# Patient Record
Sex: Female | Born: 1980 | Race: White | Hispanic: No | Marital: Single | Smoking: Never smoker
Health system: Southern US, Community
[De-identification: ages and names within clinical notes are randomized; demographics above are authoritative.]

## PROBLEM LIST (undated history)

## (undated) DIAGNOSIS — F429 Obsessive-compulsive disorder, unspecified: Secondary | ICD-10-CM

## (undated) DIAGNOSIS — J45909 Unspecified asthma, uncomplicated: Secondary | ICD-10-CM

## (undated) DIAGNOSIS — T7840XA Allergy, unspecified, initial encounter: Secondary | ICD-10-CM

## (undated) DIAGNOSIS — F32A Depression, unspecified: Secondary | ICD-10-CM

## (undated) DIAGNOSIS — F329 Major depressive disorder, single episode, unspecified: Secondary | ICD-10-CM

## (undated) DIAGNOSIS — F419 Anxiety disorder, unspecified: Secondary | ICD-10-CM

## (undated) DIAGNOSIS — E785 Hyperlipidemia, unspecified: Secondary | ICD-10-CM

## (undated) DIAGNOSIS — G43909 Migraine, unspecified, not intractable, without status migrainosus: Secondary | ICD-10-CM

## (undated) DIAGNOSIS — K589 Irritable bowel syndrome without diarrhea: Secondary | ICD-10-CM

## (undated) DIAGNOSIS — M26629 Arthralgia of temporomandibular joint, unspecified side: Secondary | ICD-10-CM

## (undated) HISTORY — DX: Migraine, unspecified, not intractable, without status migrainosus: G43.909

## (undated) HISTORY — PX: TONSILLECTOMY AND ADENOIDECTOMY: SHX28

## (undated) HISTORY — DX: Allergy, unspecified, initial encounter: T78.40XA

## (undated) HISTORY — DX: Hyperlipidemia, unspecified: E78.5

## (undated) HISTORY — PX: TONSILLECTOMY: SHX5217

## (undated) HISTORY — DX: Anxiety disorder, unspecified: F41.9

## (undated) HISTORY — DX: Major depressive disorder, single episode, unspecified: F32.9

## (undated) HISTORY — DX: Obsessive-compulsive disorder, unspecified: F42.9

## (undated) HISTORY — PX: BIOPSY BREAST: PRO8

## (undated) HISTORY — DX: Irritable bowel syndrome, unspecified: K58.9

## (undated) HISTORY — DX: Depression, unspecified: F32.A

## (undated) HISTORY — DX: Unspecified asthma, uncomplicated: J45.909

## (undated) HISTORY — DX: Arthralgia of temporomandibular joint, unspecified side: M26.629

---

## 1999-03-20 ENCOUNTER — Emergency Department (HOSPITAL_COMMUNITY): Admission: EM | Admit: 1999-03-20 | Discharge: 1999-03-20 | Payer: Self-pay | Admitting: Emergency Medicine

## 1999-09-23 ENCOUNTER — Ambulatory Visit (HOSPITAL_COMMUNITY): Admission: RE | Admit: 1999-09-23 | Discharge: 1999-09-23 | Payer: Self-pay | Admitting: *Deleted

## 2000-01-01 ENCOUNTER — Ambulatory Visit (HOSPITAL_BASED_OUTPATIENT_CLINIC_OR_DEPARTMENT_OTHER): Admission: RE | Admit: 2000-01-01 | Discharge: 2000-01-01 | Payer: Self-pay | Admitting: Surgery

## 2000-08-31 ENCOUNTER — Ambulatory Visit (HOSPITAL_COMMUNITY): Admission: RE | Admit: 2000-08-31 | Discharge: 2000-08-31 | Payer: Self-pay | Admitting: *Deleted

## 2000-10-28 ENCOUNTER — Encounter: Admission: RE | Admit: 2000-10-28 | Discharge: 2000-10-28 | Payer: Self-pay | Admitting: Internal Medicine

## 2000-10-28 ENCOUNTER — Encounter: Payer: Self-pay | Admitting: Internal Medicine

## 2001-07-03 ENCOUNTER — Encounter: Admission: RE | Admit: 2001-07-03 | Discharge: 2001-07-03 | Payer: Self-pay | Admitting: Family Medicine

## 2001-07-03 ENCOUNTER — Encounter: Payer: Self-pay | Admitting: Surgery

## 2001-08-04 ENCOUNTER — Ambulatory Visit (HOSPITAL_BASED_OUTPATIENT_CLINIC_OR_DEPARTMENT_OTHER): Admission: RE | Admit: 2001-08-04 | Discharge: 2001-08-04 | Payer: Self-pay | Admitting: Surgery

## 2001-08-04 ENCOUNTER — Encounter (INDEPENDENT_AMBULATORY_CARE_PROVIDER_SITE_OTHER): Payer: Self-pay | Admitting: Specialist

## 2002-03-28 ENCOUNTER — Encounter: Payer: Self-pay | Admitting: Internal Medicine

## 2002-03-28 ENCOUNTER — Ambulatory Visit (HOSPITAL_COMMUNITY): Admission: RE | Admit: 2002-03-28 | Discharge: 2002-03-28 | Payer: Self-pay | Admitting: Internal Medicine

## 2002-04-02 ENCOUNTER — Encounter: Payer: Self-pay | Admitting: Surgery

## 2002-04-02 ENCOUNTER — Ambulatory Visit (HOSPITAL_COMMUNITY): Admission: RE | Admit: 2002-04-02 | Discharge: 2002-04-02 | Payer: Self-pay | Admitting: Surgery

## 2002-10-07 ENCOUNTER — Emergency Department (HOSPITAL_COMMUNITY): Admission: EM | Admit: 2002-10-07 | Discharge: 2002-10-07 | Payer: Self-pay | Admitting: Emergency Medicine

## 2002-10-18 ENCOUNTER — Encounter: Admission: RE | Admit: 2002-10-18 | Discharge: 2002-10-18 | Payer: Self-pay | Admitting: Internal Medicine

## 2002-10-18 ENCOUNTER — Encounter: Payer: Self-pay | Admitting: Internal Medicine

## 2002-11-20 ENCOUNTER — Ambulatory Visit (HOSPITAL_COMMUNITY): Admission: RE | Admit: 2002-11-20 | Discharge: 2002-11-20 | Payer: Self-pay | Admitting: Internal Medicine

## 2002-11-20 ENCOUNTER — Encounter: Payer: Self-pay | Admitting: Internal Medicine

## 2003-10-19 ENCOUNTER — Emergency Department (HOSPITAL_COMMUNITY): Admission: EM | Admit: 2003-10-19 | Discharge: 2003-10-19 | Payer: Self-pay | Admitting: Emergency Medicine

## 2004-02-18 ENCOUNTER — Encounter (INDEPENDENT_AMBULATORY_CARE_PROVIDER_SITE_OTHER): Payer: Self-pay | Admitting: *Deleted

## 2004-02-18 ENCOUNTER — Ambulatory Visit (HOSPITAL_COMMUNITY): Admission: RE | Admit: 2004-02-18 | Discharge: 2004-02-18 | Payer: Self-pay | Admitting: Surgery

## 2004-02-18 ENCOUNTER — Ambulatory Visit (HOSPITAL_BASED_OUTPATIENT_CLINIC_OR_DEPARTMENT_OTHER): Admission: RE | Admit: 2004-02-18 | Discharge: 2004-02-18 | Payer: Self-pay | Admitting: Surgery

## 2004-03-02 ENCOUNTER — Emergency Department (HOSPITAL_COMMUNITY): Admission: EM | Admit: 2004-03-02 | Discharge: 2004-03-02 | Payer: Self-pay

## 2004-03-09 ENCOUNTER — Ambulatory Visit (HOSPITAL_COMMUNITY): Admission: RE | Admit: 2004-03-09 | Discharge: 2004-03-09 | Payer: Self-pay | Admitting: Internal Medicine

## 2005-02-01 ENCOUNTER — Other Ambulatory Visit: Admission: RE | Admit: 2005-02-01 | Discharge: 2005-02-01 | Payer: Self-pay | Admitting: Internal Medicine

## 2005-02-25 ENCOUNTER — Ambulatory Visit (HOSPITAL_COMMUNITY): Admission: RE | Admit: 2005-02-25 | Discharge: 2005-02-25 | Payer: Self-pay | Admitting: Allergy

## 2005-03-22 ENCOUNTER — Emergency Department (HOSPITAL_COMMUNITY): Admission: EM | Admit: 2005-03-22 | Discharge: 2005-03-22 | Payer: Self-pay | Admitting: Emergency Medicine

## 2005-03-29 ENCOUNTER — Ambulatory Visit: Payer: Self-pay | Admitting: Cardiology

## 2005-03-30 ENCOUNTER — Ambulatory Visit: Payer: Self-pay | Admitting: Gastroenterology

## 2005-04-07 ENCOUNTER — Ambulatory Visit: Payer: Self-pay | Admitting: Cardiology

## 2005-04-07 ENCOUNTER — Ambulatory Visit: Payer: Self-pay

## 2005-04-20 ENCOUNTER — Ambulatory Visit: Payer: Self-pay | Admitting: Gastroenterology

## 2005-06-10 ENCOUNTER — Ambulatory Visit: Payer: Self-pay

## 2005-06-18 ENCOUNTER — Ambulatory Visit: Payer: Self-pay | Admitting: Gastroenterology

## 2005-11-01 ENCOUNTER — Encounter: Admission: RE | Admit: 2005-11-01 | Discharge: 2005-11-01 | Payer: Self-pay | Admitting: Obstetrics and Gynecology

## 2006-03-23 ENCOUNTER — Emergency Department (HOSPITAL_COMMUNITY): Admission: EM | Admit: 2006-03-23 | Discharge: 2006-03-23 | Payer: Self-pay | Admitting: Family Medicine

## 2006-04-24 ENCOUNTER — Emergency Department (HOSPITAL_COMMUNITY): Admission: EM | Admit: 2006-04-24 | Discharge: 2006-04-24 | Payer: Self-pay | Admitting: Family Medicine

## 2006-08-11 ENCOUNTER — Encounter: Admission: RE | Admit: 2006-08-11 | Discharge: 2006-08-11 | Payer: Self-pay

## 2006-08-19 ENCOUNTER — Other Ambulatory Visit: Admission: RE | Admit: 2006-08-19 | Discharge: 2006-08-19 | Payer: Self-pay | Admitting: Obstetrics and Gynecology

## 2007-02-17 ENCOUNTER — Ambulatory Visit (HOSPITAL_COMMUNITY): Admission: RE | Admit: 2007-02-17 | Discharge: 2007-02-17 | Payer: Self-pay | Admitting: Internal Medicine

## 2007-08-25 ENCOUNTER — Other Ambulatory Visit: Admission: RE | Admit: 2007-08-25 | Discharge: 2007-08-25 | Payer: Self-pay | Admitting: Obstetrics and Gynecology

## 2007-09-04 ENCOUNTER — Encounter: Admission: RE | Admit: 2007-09-04 | Discharge: 2007-09-04 | Payer: Self-pay | Admitting: Obstetrics and Gynecology

## 2009-07-22 ENCOUNTER — Ambulatory Visit (HOSPITAL_COMMUNITY): Admission: RE | Admit: 2009-07-22 | Discharge: 2009-07-22 | Payer: Self-pay | Admitting: Internal Medicine

## 2010-08-11 IMAGING — CR DG SHOULDER 2+V*L*
3 series · 3 of 3 positions shown · non-contrast
Comparison: None

CLINICAL DATA: Left shoulder pain.

LEFT SHOULDER - 2+ VIEW

[view not recorded (1 of 3)]
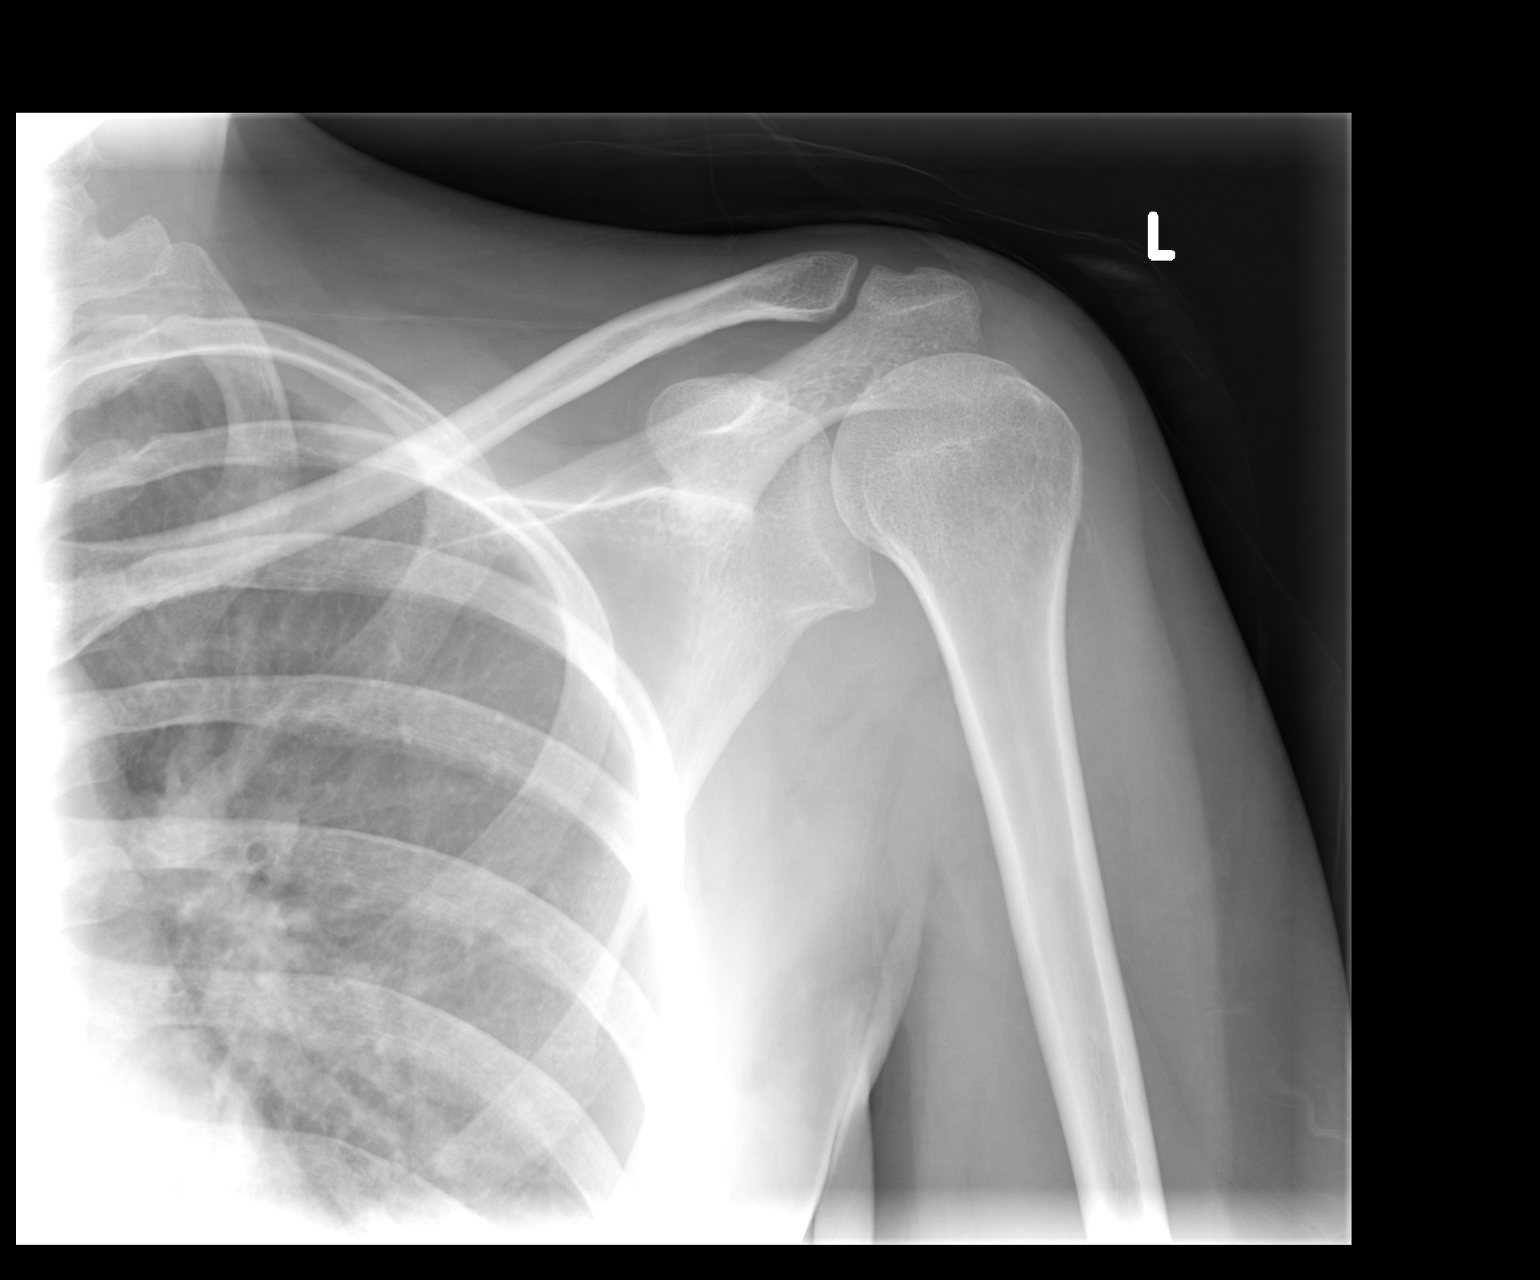

[view not recorded (2 of 3)]
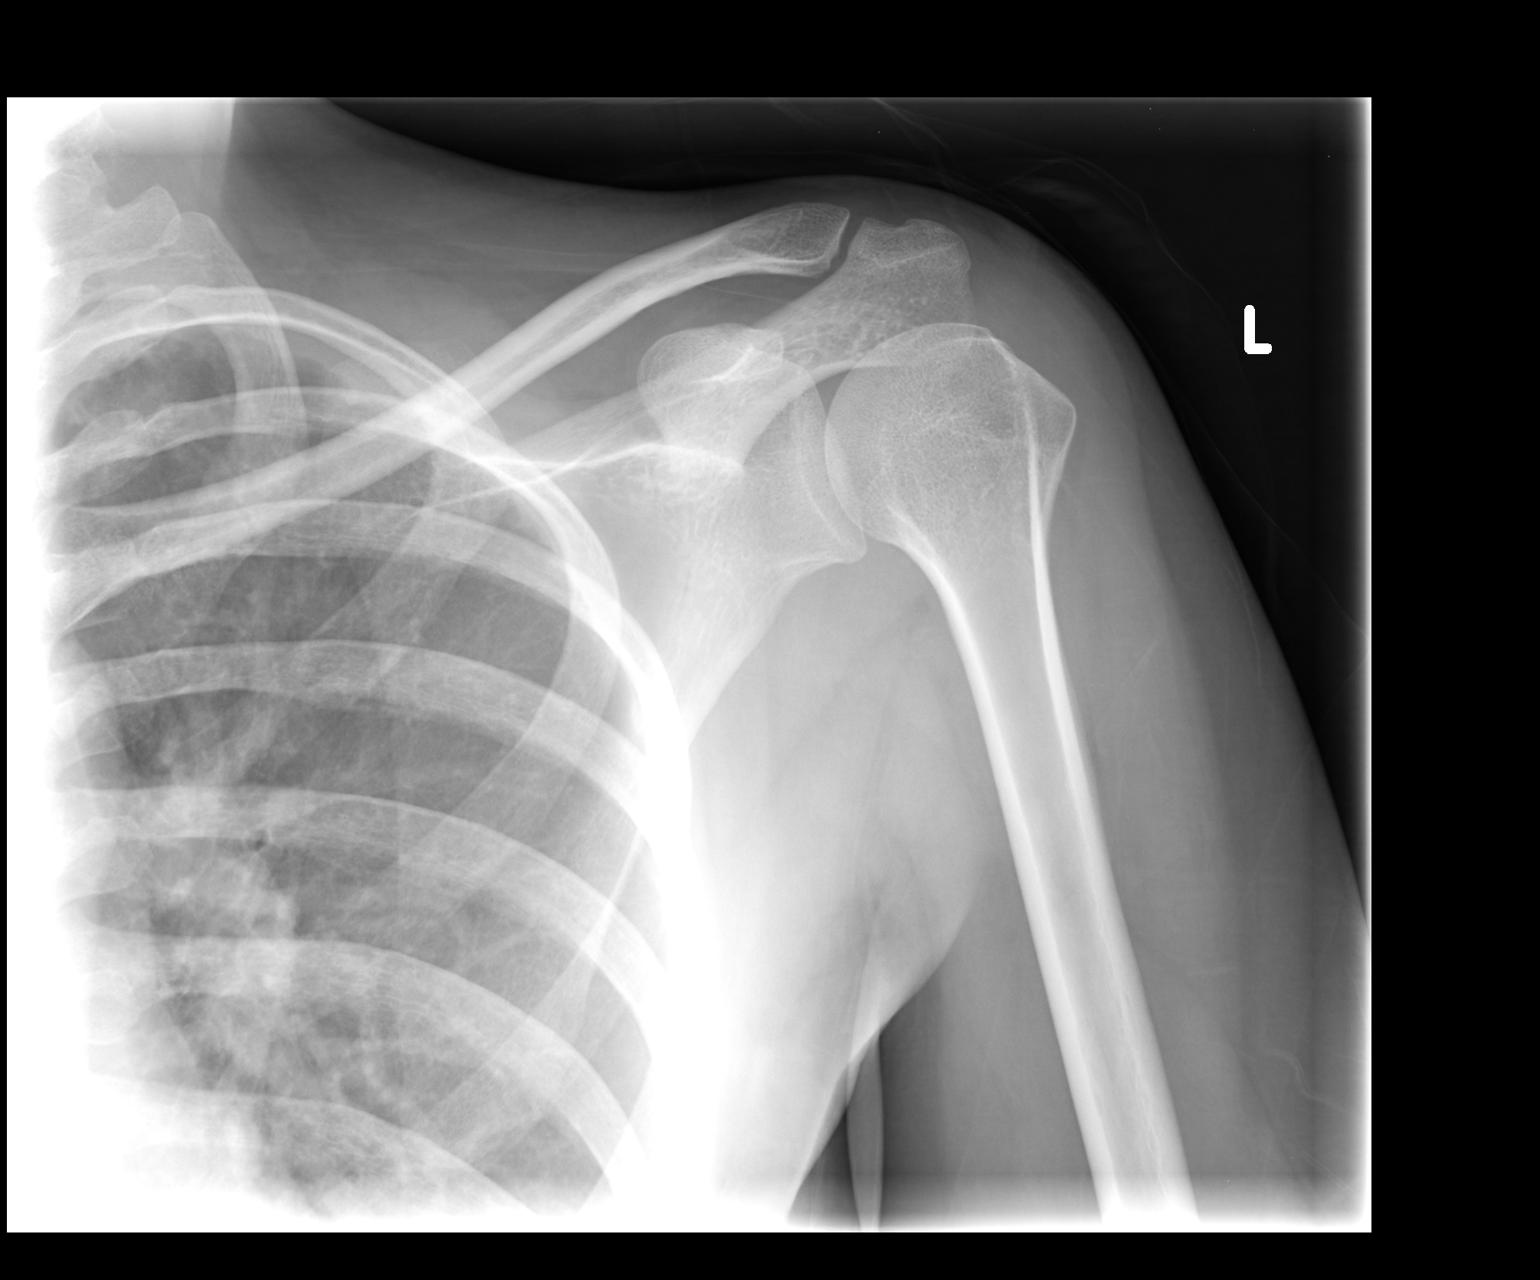

[view not recorded (3 of 3)]
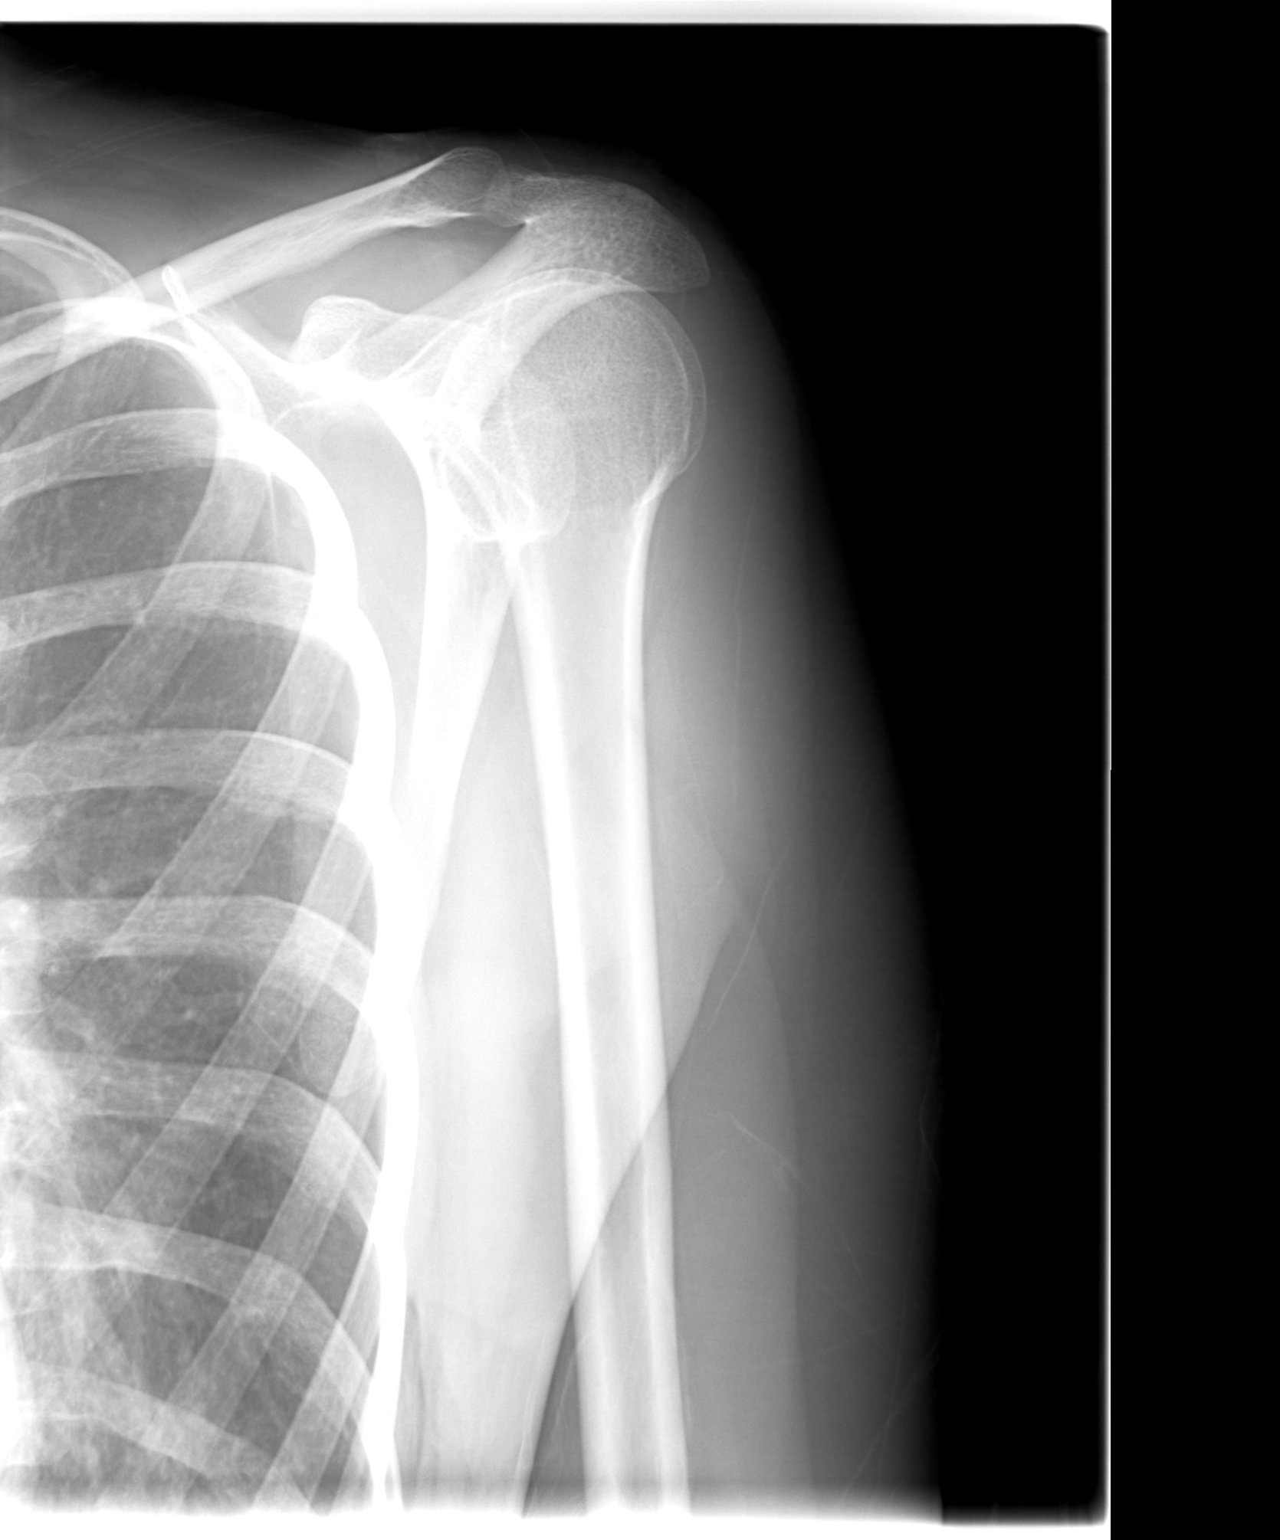

[3 of 3 positions shown; findings below may reference images not displayed]

FINDINGS: There is no evidence of fracture or dislocation.  There
is no evidence of arthropathy or other focal bone abnormality.
Soft tissues are unremarkable.
IMPRESSION: Negative.

## 2010-10-25 ENCOUNTER — Encounter: Payer: Self-pay | Admitting: Internal Medicine

## 2010-10-25 ENCOUNTER — Encounter: Payer: Self-pay | Admitting: Surgery

## 2011-02-19 NOTE — Op Note (Signed)
[place]. [facility]  Patient:    Makayla [name], Makayla [name]                   MRN: [id number] Proc. Date: 01/01/00 Attending:  Thornton Park. Daphine Deutscher, M.D. CC:         Nicky Pugh. Lenon Ahmadi, M.D.                           Operative Report  CCS#: [id number]  PREOPERATIVE DIAGNOSIS:  Palpable masses in both breasts.  POSTOPERATIVE DIAGNOSIS:  Bilateral fibroadenomata.  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  We discussed management strategies and with her mother present, the three of [place] came up with a plan because these ere quite large to go ahead and remove them on both sides.  SURGEON:  Thornton Park. Daphine Deutscher, M.D.  DESCRIPTION OF PROCEDURE:  Ms. Soyars was taken to OR 2 after localizing these areas with her and her mother in the holding area and marking them.  I described small incisions in the left upper quadrant and the right lateral aspect of her breast and then infiltrated these, first working on the left side with lidocaine. Under MAC anesthesia and after Betadine prepping, draping and a sterile field,  made an incision, carried this down and got down just to within the breast tissue and into the capsule of a very large fibroadenoma in the left breast.  This was  removed without difficulty.  It was approximately 2 cm in greatest dimension and was sent for permanent sections.  I could not feel any other large masses, although, I suspect she does have small fibroadenomata throughout the left breast. This was then closed with 4-0 Vicryl and a 5-0 Vicryl subcuticularly.  In the right side, I found the other area and marked it and injected it with Marcaine and made a similar type incision.  This was carried down to  the capsule and I incised this and then removed a fairly large fibroadenomata from that breast as well.  No other palpable fibroadenoma were appreciated.  I then closed this ith 4-0 Vicryl and 5-0 Vicryl with benzoin and Steri-Strips.  The patient seemed to  tolerated the procedure well.  Because she has difficulty swallowing, I am giving her an elixir of Tylenol #3 to take for pain.  She also will come back to see me in 2-3 weeks.  She also has permits to be off from school through April 2. DD:  01/01/00 TD:  01/01/00 Job: [id number]

## 2011-02-19 NOTE — Op Note (Signed)
NAME:  Makayla [name], Makayla [name]                       ACCOUNT NO.:  [id number]   MEDICAL RECORD NO.:  [id number]                   PATIENT TYPE:  AMB   LOCATION:  DSC                                  FACILITY:  [facility]   PHYSICIAN:  Thornton Park. Daphine Deutscher, M.D.             DATE OF BIRTH:  03/01/1981   DATE OF PROCEDURE:  02/18/2004  DATE OF DISCHARGE:                                 OPERATIVE REPORT   CCS# [id number]   PREOPERATIVE DIAGNOSIS:  Palpable mass, right breast.  History of  fibroadenomas.   POSTOPERATIVE DIAGNOSIS:  Probable fibroadenoma, right breast.   OPERATION PERFORMED:   SURGEON:  Matthew B. Daphine Deutscher, M.D.   ANESTHESIA:  Monitored anesthesia care.   INDICATIONS FOR PROCEDURE:  The patient is [name] 30 year old white female who  has previously had fibroadenomata.  She presented with increasing pain in  her right breast with [name] palpable mass at about the 1 o'clock position about  three fingerbreadths from the areolar margin.  Because it was sore, and  bothering her and increasing in size and [name] new palpable mass.  She wanted to  go ahead and get this removed.   DESCRIPTION OF PROCEDURE:  [name] was taken to room 3 at [facility] and given intravenous sedation.  The area was previously  marked with her assistance and then after prepping with Betadine and draping  sterilely, I infiltrated the area with 1% lidocaine.  [name] small  curvilinear  incision was made in the mass.  I cut down to it and I basically dissected  it away from her breast to get it out through [name] small incision.  The mass  itself was about the size of [name] sphere of [name] quarter sized diameter.  This was  removed in toto.  Bleeding was controlled with electrocautery.  The breast  tissue was approximated with 4-0 Vicryl as was the subcutaneous tissue.  The  wound itself was closed with interrupted 5-0 Vicryl with benzoin and Steri-  Strips on the skin.  The patient tolerated the procedure well.  She will be  given some Vicodin for pain and be followed up in the office in  approximately two weeks.                                               Thornton Park Daphine Deutscher, M.D.    MBM/MEDQ  D:  02/18/2004  T:  02/19/2004  Job:  [id number]

## 2011-02-19 NOTE — Op Note (Signed)
[place]. [facility]  Patient:    Makayla [name], Makayla [name] Visit Number: [id number] MRN: [id number]          Service Type: DSU Location: [facility] Attending Physician:  Katha Cabal Dictated by:   Thornton Park Daphine Deutscher, M.D. Proc. Date: 08/04/01 Admit Date:  08/04/2001   CC:         Marinus Maw, M.D.   Operative Report  CCS [phone].  PREOPERATIVE DIAGNOSIS:  Fibroadenoma of the left breast.  POSTOPERATIVE DIAGNOSIS:  Three fibroadenomas from right breast.  PROCEDURE:  Left breast biopsy.  SURGEON:  Thornton Park. Daphine Deutscher, M.D.  ANESTHESIA:  General.  DESCRIPTION OF PROCEDURE:  [name] was taken to room 6 at [facility] day [facility], where three discrete palpable masses were noted from the 12 oclock position down to about the 2 oclock position in the left upper outer quadrant.  Midway between she had had a previous breast biopsy, and I used that as an area that I would try to create one incision so that I could go to all three if possible.  I discussed this with her pre-op.  The breast was prepped with Betadine and draped sterilely.  I made an incision in a curvilinear fashion and put a little mark to help orient the closure.  I then went to the 12 oclock area first and had to kind of undermine the medial flap and then came down on about a 2 cm mass, which I was able to get down on the capsule, mobilize, and deliver without much difficulty.  Cautery was used to control bleeding in the bed of the biopsy.  In the 1 oclock position I dissected down and shelled out a grape-sized mass in a similar fashion, again using electrocautery.  At the 2 to 3 oclock position I brought up into view an area and then cut down on breast tissue and again was able to enter the capsule around a fairly discrete fibroadenoma, which I removed.  These were sent separately pathologically, but all grossly resembled fibroadenomata.  The wound was irrigated with saline and then  was closed with 4-0 Vicryl subcutaneously and then subcuticularly with 5-0 Vicryl with benzoin and Steri-Strips.  The patient seemed to tolerate the procedure well.  She will be given Vicodin for pain and will be followed up in the office in approximately three weeks. Dictated by:   Thornton Park Daphine Deutscher, M.D. Attending Physician:  Katha Cabal DD:  08/04/01 TD:  08/05/01 Job: [id number]

## 2013-09-19 ENCOUNTER — Encounter: Payer: Self-pay | Admitting: Internal Medicine

## 2013-09-19 ENCOUNTER — Other Ambulatory Visit: Payer: Self-pay | Admitting: Physician Assistant

## 2013-09-19 DIAGNOSIS — F419 Anxiety disorder, unspecified: Secondary | ICD-10-CM | POA: Insufficient documentation

## 2013-09-19 DIAGNOSIS — K589 Irritable bowel syndrome without diarrhea: Secondary | ICD-10-CM | POA: Insufficient documentation

## 2013-09-19 DIAGNOSIS — E785 Hyperlipidemia, unspecified: Secondary | ICD-10-CM | POA: Insufficient documentation

## 2013-09-19 DIAGNOSIS — G43909 Migraine, unspecified, not intractable, without status migrainosus: Secondary | ICD-10-CM | POA: Insufficient documentation

## 2013-09-19 DIAGNOSIS — T7840XA Allergy, unspecified, initial encounter: Secondary | ICD-10-CM | POA: Insufficient documentation

## 2013-09-19 DIAGNOSIS — M26629 Arthralgia of temporomandibular joint, unspecified side: Secondary | ICD-10-CM | POA: Insufficient documentation

## 2013-09-19 DIAGNOSIS — F429 Obsessive-compulsive disorder, unspecified: Secondary | ICD-10-CM | POA: Insufficient documentation

## 2013-09-19 DIAGNOSIS — F329 Major depressive disorder, single episode, unspecified: Secondary | ICD-10-CM | POA: Insufficient documentation

## 2013-09-19 DIAGNOSIS — J45909 Unspecified asthma, uncomplicated: Secondary | ICD-10-CM | POA: Insufficient documentation

## 2013-09-19 DIAGNOSIS — F32A Depression, unspecified: Secondary | ICD-10-CM | POA: Insufficient documentation

## 2013-09-19 MED ORDER — IMIPRAMINE HCL 50 MG PO TABS: 50.0000 mg | ORAL_TABLET | Freq: Every day | ORAL | Status: DC

## 2013-09-20 ENCOUNTER — Other Ambulatory Visit: Payer: Self-pay | Admitting: Physician Assistant

## 2013-09-20 ENCOUNTER — Ambulatory Visit: Payer: Self-pay | Admitting: Physician Assistant

## 2013-10-10 ENCOUNTER — Ambulatory Visit: Payer: Self-pay | Admitting: Physician Assistant

## 2013-10-18 ENCOUNTER — Other Ambulatory Visit: Payer: Self-pay | Admitting: Physician Assistant

## 2013-10-24 NOTE — Telephone Encounter (Signed)
SCHEDULED PT TO COME BACK FOR A F/U ON MEDS = 10/29/13

## 2013-10-30 ENCOUNTER — Ambulatory Visit: Payer: Self-pay | Admitting: Physician Assistant

## 2013-11-01 ENCOUNTER — Ambulatory Visit: Payer: Self-pay | Admitting: Physician Assistant

## 2013-11-02 ENCOUNTER — Ambulatory Visit (INDEPENDENT_AMBULATORY_CARE_PROVIDER_SITE_OTHER): Payer: Self-pay | Admitting: Physician Assistant

## 2013-11-02 ENCOUNTER — Encounter: Payer: Self-pay | Admitting: Physician Assistant

## 2013-11-02 VITALS — BP 100/60 | HR 100 | Temp 98.1°F | Resp 16 | Ht 67.5 in | Wt 121.0 lb

## 2013-11-02 DIAGNOSIS — F411 Generalized anxiety disorder: Secondary | ICD-10-CM

## 2013-11-02 MED ORDER — IMIPRAMINE HCL 50 MG PO TABS: ORAL_TABLET | ORAL | Status: DC

## 2013-11-02 MED ORDER — ALPRAZOLAM 0.5 MG PO TABS: ORAL_TABLET | ORAL | Status: DC

## 2013-11-02 NOTE — Patient Instructions (Signed)
Obsessive-Compulsive Disorder Obsessive-compulsive disorder (OCD) is a mental illness. People with OCD have obsessions, compulsions, or both. Obsessions are unwanted and distressing thoughts, ideas, or urges that keep entering your mind. You may find yourself trying to ignore them. You may try to stop or undo them with a compulsion. Compulsions are repetitive physical or mental acts that you feel driven to perform. The actsmay seem senseless or excessive. They usually reduce or prevent any emotional distress. Obsessions and compulsions can be time consuming (take more than 1 hour a day). They can interfere with personal relationships and normal activities at home, school, or work.  OCD usually starts in young adulthood and continues throughout life. People who have family members with OCD are at higher risk for developing OCD. Women are at higher risk for OCD during and after pregnancy. Many people with OCD also have depression or another mental health disorder. SYMPTOMS  People with obsessions usually have a fear that something terrible will happen or that they will do something terrible. Examples of common obsessions include:  Fear of contamination with germs, bodily waste, or poisonous substances. Fear of making the wrong decision (self-doubt). Violent or sexual thoughts or urges towards others. Need for symmetry or exactness. Examples of typical compulsive acts include:  Excessive hand washing or bathing due to fear of contamination. Checking things over and over to make sure a task has been completed. For example, making sure a door is locked or a toaster is unplugged. Repeating an act or phrase over and over, sometimes a specific number of times, until it feels right. Arranging and rearranging objects to keep them in a certain order. DIAGNOSIS  OCD is diagnosed through an assessment by your health care provider. Your health care provider will ask questions about any obsessions or compulsions  you have and how they affect your life. Your health care provider may also ask about your medical history, prescription medicine, and drug use. Certain medical conditions and substances can cause symptoms similar to OCD. TREATMENT  The following forms of treatment are available for OCD: Cognitive therapy. This is a form of talk therapy. The goal is to identify and change the irrational thoughts associated with obsessions. Behavioral therapy. This is also a form of talk therapy. The goal is to target and reduce compulsive acts (behaviors). A specific type of behavioral therapy called exposure and response prevention is considered the most effective treatment for OCD. You will be exposed to the distressing situation that triggers your compulsion and prevented from responding to it. With repetition of this process over time, you will no longer feel the distress or need to perform the compulsion.  Medicine. Certain types of antidepressant medicine may help reduce or control OCD symptoms. Medication is most effective when it is used with cognitive or behavioral therapy. For severe OCD that does not respond to talk therapy and medication, brain surgery or electrical stimulation of specific areas of the brain (deep brain stimulation) may be helpful. HOME CARE INSTRUCTIONS  Take all medicines as prescribed. Check with your health care provider before starting new prescription or over-the-counter medicines. SEEK MEDICAL CARE IF: If you are not able to take your medicines as prescribed. If your symptoms get worse. SEEK IMMEDIATE MEDICAL CARE IF:  You feel that any of your ideas or actions are slipping out of your control. Document Released: 09/14/2001 Document Revised: 07/11/2013 Document Reviewed: 05/04/2013 [facility] Patient Information 2014 [place], [place]. Generalized Anxiety Disorder Generalized anxiety disorder (GAD) is a mental disorder. It interferes  with life functions, including relationships,  work, and school. GAD is different from normal anxiety, which everyone experiences at some point in their lives in response to specific life events and activities. Normal anxiety actually helps [place] prepare for and get through these life events and activities. Normal anxiety goes away after the event or activity is over.  GAD causes anxiety that is not necessarily related to specific events or activities. It also causes excess anxiety in proportion to specific events or activities. The anxiety associated with GAD is also difficult to control. GAD can vary from mild to severe. People with severe GAD can have intense waves of anxiety with physical symptoms (panic attacks).  SYMPTOMS The anxiety and worry associated with GAD are difficult to control. This anxiety and worry are related to many life events and activities and also occur more days than not for 6 months or longer. People with GAD also have three or more of the following symptoms (one or more in children):  Restlessness.   Fatigue.  Difficulty concentrating.   Irritability.  Muscle tension.  Difficulty sleeping or unsatisfying sleep. DIAGNOSIS GAD is diagnosed through an assessment by your caregiver. Your caregiver will ask you questions aboutyour mood,physical symptoms, and events in your life. Your caregiver may ask you about your medical history and use of alcohol or drugs, including prescription medications. Your caregiver may also do a physical exam and blood tests. Certain medical conditions and the use of certain substances can cause symptoms similar to those associated with GAD. Your caregiver may refer you to a mental health specialist for further evaluation. TREATMENT The following therapies are usually used to treat GAD:   Medication Antidepressant medication usually is prescribed for long-term daily control. Antianxiety medications may be added in severe cases, especially when panic attacks occur.   Talk therapy  (psychotherapy) Certain types of talk therapy can be helpful in treating GAD by providing support, education, and guidance. A form of talk therapy called cognitive behavioral therapy can teach you healthy ways to think about and react to daily life events and activities.  Stress managementtechniques These include yoga, meditation, and exercise and can be very helpful when they are practiced regularly. A mental health specialist can help determine which treatment is best for you. Some people see improvement with one therapy. However, other people require a combination of therapies. Document Released: 01/15/2013 Document Reviewed: 01/15/2013 [facility] Patient Information 2014 [place], [place].

## 2013-11-02 NOTE — Progress Notes (Signed)
   Subjective:    Patient ID: Makayla LovingsPatricia A [name], female    DOB: Dec 16, 1980, 33 y.o.   MRN: [id number]  HPI Patient has extreme anxiety and OCD. She had lost her job but will start a new job at [employer] until she decides what she would like to do. [place] in [place] and he wants her to move there but she has high anxiety about this. She states with her OCD she was seeing Dr. Sharl MaKerr, [facility] and while on multiple medications she has headaches, shakes. She is not seeing a counselor currently. She has to clean her dryer every time she has to do laundry that takes 4-5 hours.   Makayla [name] is a 33 y.o. female who presents for follow up of anxiety disorder, obsessive compulsive disorder and panic attacks. She has the following anxiety symptoms: chest pain, difficulty concentrating, irritable, panic attacks and psychomotor agitation. Onset of symptoms was approximately several years ago. Symptoms have been gradually worsening since that time. She denies current suicidal and homicidal ideation. Family history significant for anxiety. Risk factors: negative life event lost her job, changing jobs, she complains of her mother, lack of friends/support. Previous treatment includes unknown, patient has list detail that she will bring back. She complains of the following medication side effects: none. The following portions of the patient's history were reviewed and updated as appropriate: allergies, current medications, past family history, past medical history, past social history, past surgical history and problem list.  She denies suicidal or homicidal ideation.   Review of Systems A comprehensive review of systems was negative except for: Musculoskeletal: positive for bone pain     Physical Exam  BP 100/60  Pulse 100  Temp(Src) 98.1 F (36.7 C)  Resp 16  Ht 5' 7.5" (1.715 m)  Wt 121 lb (54.885 kg)  BMI 18.66 kg/m2  LMP 10/04/2013 General  appearance: alert Back: symmetric, no curvature. ROM normal. No CVA tenderness., + Costochondritis Lungs: clear to auscultation bilaterally Heart: regular rate and rhythm, S1, S2 normal, no murmur, click, rub or gallop Abdomen: soft, non-tender; bowel sounds normal; no masses,  no organomegaly Extremities: extremities normal, atraumatic, no cyanosis or edema Skin: Skin color, texture, turgor normal. No rashes or lesions Neurologic: Grossly normal   Assessment:    anxiety disorder and obsessive compulsive disorder. Possible organic contributing causes are: none.   Plan:    Medications: Xanax. Recommended counseling. Reviewed concept of anxiety as biochemical imbalance of neurotransmitters and rationale for treatment. Instructed patient to contact office or on-call physician promptly should condition worsen or any new symptoms appear and provided on-call telephone numbers. IF THE PATIENT HAS ANY SUICIDAL OR HOMICIDAL IDEATIONS, CALL THE OFFICE, DISCUSS WITH A SUPPORT MEMBER, OR GO TO THE ER IMMEDIATELY. Patient was agreeable with this plan. Follow up: 1 month. Spent 30 minutes (>50% of visit) discussing the risks of anxiety disorder and obsessive compulsive disorder, the  pathophysiology, etiology, risks, and principles of treatment.

## 2013-11-16 ENCOUNTER — Other Ambulatory Visit: Payer: Self-pay | Admitting: Physician Assistant

## 2013-11-30 ENCOUNTER — Other Ambulatory Visit: Payer: Self-pay | Admitting: Physician Assistant

## 2013-12-31 ENCOUNTER — Encounter: Payer: Self-pay | Admitting: Internal Medicine

## 2013-12-31 ENCOUNTER — Ambulatory Visit: Payer: Self-pay | Admitting: Physician Assistant

## 2013-12-31 ENCOUNTER — Ambulatory Visit (INDEPENDENT_AMBULATORY_CARE_PROVIDER_SITE_OTHER): Payer: Self-pay | Admitting: Internal Medicine

## 2013-12-31 VITALS — BP 114/66 | HR 100 | Temp 99.5°F | Resp 18 | Ht 67.5 in | Wt 119.0 lb

## 2013-12-31 DIAGNOSIS — R51 Headache: Secondary | ICD-10-CM

## 2013-12-31 DIAGNOSIS — F429 Obsessive-compulsive disorder, unspecified: Secondary | ICD-10-CM

## 2013-12-31 MED ORDER — CITALOPRAM HYDROBROMIDE 20 MG PO TABS: 20.0000 mg | ORAL_TABLET | Freq: Every day | ORAL | Status: DC

## 2013-12-31 MED ORDER — ALPRAZOLAM 0.5 MG PO TABS: ORAL_TABLET | ORAL | Status: DC

## 2013-12-31 NOTE — Progress Notes (Signed)
   Subjective:    Patient ID: Makayla [name], female    DOB: [birth year], 33 y.o.   MRN: [id number]  HPI 33 yo SWF with Hx/o migraine and severe OCD presents for eval of meds and feeling the current meds not controlling her OCD Sx's. She has been intolerant to usual  Doses of Prozac and Zoloft in the Past. HA's are improved on Imipramine.   Medication Sig   Alprazolam 0.5 mg -  1/2 to 1 tab  tid prn  . imipramine (TOFRANIL) 50 MG  Take three tablets at bedtime   Allergies  Allergen Reactions  . Zoloft [Sertraline Hcl]    Past Medical History  Diagnosis Date  . Allergy   . Asthma   . Hyperlipidemia   . IBS (irritable bowel syndrome)   . Migraines   . TMJ syndrome   . Depression   . Anxiety   . OCD (obsessive compulsive disorder)    Review of Systems noncontributory to above   Objective:   Physical Exam  BP 114/66  Pulse 100  Temp(Src) 99.5 F (37.5 C) (Temporal)  Resp 18  Ht 5' 7.5" (1.715 m)  Wt 119 lb (53.978 kg)  BMI 18.35 kg/m2  HEENT - Eac's patent. TM's Nl.EOM's full. PERRLA. NasoOroPharynx clear. Neck - supple. Nl Thyroid. No bruits nodes JVD Chest - Clear equal BS Cor - Nl HS. RRR w/o sig MGR. PP 1(+) No edema. Abd - No palpable organomegaly, masses or tenderness. BS nl. MS- FROM. w/o deformities. Muscle power tone and bulk Nl. Gait Nl. Neuro - No obvious Cr N abnormalities. Sensory, motor and Cerebellar functions appear Nl w/o focal abnormalities.  Assessment & Plan:   1. Headache(784.0)  2. OCD (obsessive compulsive disorder) - try low dose Citalopram 20 mg and F/U in 6 weeks

## 2013-12-31 NOTE — Patient Instructions (Addendum)
Generalized Anxiety Disorder Generalized anxiety disorder (GAD) is a mental disorder. It interferes with life functions, including relationships, work, and school. GAD is different from normal anxiety, which everyone experiences at some point in their lives in response to specific life events and activities. Normal anxiety actually helps us prepare for and get through these life events and activities. Normal anxiety goes away after the event or activity is over.  GAD causes anxiety that is not necessarily related to specific events or activities. It also causes excess anxiety in proportion to specific events or activities. The anxiety associated with GAD is also difficult to control. GAD can vary from mild to severe. People with severe GAD can have intense waves of anxiety with physical symptoms (panic attacks).  SYMPTOMS The anxiety and worry associated with GAD are difficult to control. This anxiety and worry are related to many life events and activities and also occur more days than not for 6 months or longer. People with GAD also have three or more of the following symptoms (one or more in children):  Restlessness.   Fatigue.  Difficulty concentrating.   Irritability.  Muscle tension.  Difficulty sleeping or unsatisfying sleep. DIAGNOSIS GAD is diagnosed through an assessment by your caregiver. Your caregiver will ask you questions aboutyour mood,physical symptoms, and events in your life. Your caregiver may ask you about your medical history and use of alcohol or drugs, including prescription medications. Your caregiver may also do a physical exam and blood tests. Certain medical conditions and the use of certain substances can cause symptoms similar to those associated with GAD. Your caregiver may refer you to a mental health specialist for further evaluation. TREATMENT The following therapies are usually used to treat GAD:   Medication Antidepressant medication usually is  prescribed for long-term daily control. Antianxiety medications may be added in severe cases, especially when panic attacks occur.   Talk therapy (psychotherapy) Certain types of talk therapy can be helpful in treating GAD by providing support, education, and guidance. A form of talk therapy called cognitive behavioral therapy can teach you healthy ways to think about and react to daily life events and activities.  Stress managementtechniques These include yoga, meditation, and exercise and can be very helpful when they are practiced regularly. A mental health specialist can help determine which treatment is best for you. Some people see improvement with one therapy. However, other people require a combination of therapies. Document Released: 01/15/2013 Document Reviewed: 01/15/2013 [facility] Patient Information 2014 [place], [place].   Headaches, Frequently Asked Questions MIGRAINE HEADACHES Q: What is migraine? What causes it? How can I treat it? A: Generally, migraine headaches begin as a dull ache. Then they develop into a constant, throbbing, and pulsating pain. You may experience pain at the temples. You may experience pain at the front or back of one or both sides of the head. The pain is usually accompanied by a combination of:  Nausea.  Vomiting.  Sensitivity to light and noise. Some people (about 15%) experience an aura (see below) before an attack. The cause of migraine is believed to be chemical reactions in the brain. Treatment for migraine may include over-the-counter or prescription medications. It may also include self-help techniques. These include relaxation training and biofeedback.  Q: What is an aura? A: About 15% of people with migraine get an "aura". This is a sign of neurological symptoms that occur before a migraine headache. You may see wavy or jagged lines, dots, or flashing lights. You might experience tunnel  vision or blind spots in one or both eyes. The aura can  include visual or auditory hallucinations (something imagined). It may include disruptions in smell (such as strange odors), taste or touch. Other symptoms include:  Numbness.  A "pins and needles" sensation.  Difficulty in recalling or speaking the correct word. These neurological events may last as long as 60 minutes. These symptoms will fade as the headache begins. Q: What is a trigger? A: Certain physical or environmental factors can lead to or "trigger" a migraine. These include:  Foods.  Hormonal changes.  Weather.  Stress. It is important to remember that triggers are different for everyone. To help prevent migraine attacks, you need to figure out which triggers affect you. Keep a headache diary. This is a good way to track triggers. The diary will help you talk to your healthcare professional about your condition. Q: Does weather affect migraines? A: Bright sunshine, hot, humid conditions, and drastic changes in barometric pressure may lead to, or "trigger," a migraine attack in some people. But studies have shown that weather does not act as a trigger for everyone with migraines. Q: What is the link between migraine and hormones? A: Hormones start and regulate many of your body's functions. Hormones keep your body in balance within a constantly changing environment. The levels of hormones in your body are unbalanced at times. Examples are during menstruation, pregnancy, or menopause. That can lead to a migraine attack. In fact, about three quarters of all women with migraine report that their attacks are related to the menstrual cycle.  Q: Is there an increased risk of stroke for migraine sufferers? A: The likelihood of a migraine attack causing a stroke is very remote. That is not to say that migraine sufferers cannot have a stroke associated with their migraines. In persons under age 29, the most common associated factor for stroke is migraine headache. But over the course of a  person's normal life span, the occurrence of migraine headache may actually be associated with a reduced risk of dying from cerebrovascular disease due to stroke.  Q: What are acute medications for migraine? A: Acute medications are used to treat the pain of the headache after it has started. Examples over-the-counter medications, NSAIDs, ergots, and triptans.  Q: What are the triptans? A: Triptans are the newest class of abortive medications. They are specifically targeted to treat migraine. Triptans are vasoconstrictors. They moderate some chemical reactions in the brain. The triptans work on receptors in your brain. Triptans help to restore the balance of a neurotransmitter called serotonin. Fluctuations in levels of serotonin are thought to be a main cause of migraine.  Q: Are over-the-counter medications for migraine effective? A: Over-the-counter, or "OTC," medications may be effective in relieving mild to moderate pain and associated symptoms of migraine. But you should see your caregiver before beginning any treatment regimen for migraine.  Q: What are preventive medications for migraine? A: Preventive medications for migraine are sometimes referred to as "prophylactic" treatments. They are used to reduce the frequency, severity, and length of migraine attacks. Examples of preventive medications include antiepileptic medications, antidepressants, beta-blockers, calcium channel blockers, and NSAIDs (nonsteroidal anti-inflammatory drugs). Q: Why are anticonvulsants used to treat migraine? A: During the past few years, there has been an increased interest in antiepileptic drugs for the prevention of migraine. They are sometimes referred to as "anticonvulsants". Both epilepsy and migraine may be caused by similar reactions in the brain.  Q: Why are antidepressants used to treat  migraine? A: Antidepressants are typically used to treat people with depression. They may reduce migraine frequency by  regulating chemical levels, such as serotonin, in the brain.  Q: What alternative therapies are used to treat migraine? A: The term "alternative therapies" is often used to describe treatments considered outside the scope of conventional Western medicine. Examples of alternative therapy include acupuncture, acupressure, and yoga. Another common alternative treatment is herbal therapy. Some herbs are believed to relieve headache pain. Always discuss alternative therapies with your caregiver before proceeding. Some herbal products contain arsenic and other toxins. TENSION HEADACHES Q: What is a tension-type headache? What causes it? How can I treat it? A: Tension-type headaches occur randomly. They are often the result of temporary stress, anxiety, fatigue, or anger. Symptoms include soreness in your temples, a tightening band-like sensation around your head (a "vice-like" ache). Symptoms can also include a pulling feeling, pressure sensations, and contracting head and neck muscles. The headache begins in your forehead, temples, or the back of your head and neck. Treatment for tension-type headache may include over-the-counter or prescription medications. Treatment may also include self-help techniques such as relaxation training and biofeedback. CLUSTER HEADACHES Q: What is a cluster headache? What causes it? How can I treat it? A: Cluster headache gets its name because the attacks come in groups. The pain arrives with little, if any, warning. It is usually on one side of the head. A tearing or bloodshot eye and a runny nose on the same side of the headache may also accompany the pain. Cluster headaches are believed to be caused by chemical reactions in the brain. They have been described as the most severe and intense of any headache type. Treatment for cluster headache includes prescription medication and oxygen. SINUS HEADACHES Q: What is a sinus headache? What causes it? How can I treat it? A: When a  cavity in the bones of the face and skull (a sinus) becomes inflamed, the inflammation will cause localized pain. This condition is usually the result of an allergic reaction, a tumor, or an infection. If your headache is caused by a sinus blockage, such as an infection, you will probably have a fever. An x-ray will confirm a sinus blockage. Your caregiver's treatment might include antibiotics for the infection, as well as antihistamines or decongestants.  REBOUND HEADACHES Q: What is a rebound headache? What causes it? How can I treat it? A: A pattern of taking acute headache medications too often can lead to a condition known as "rebound headache." A pattern of taking too much headache medication includes taking it more than 2 days per week or in excessive amounts. That means more than the label or a caregiver advises. With rebound headaches, your medications not only stop relieving pain, they actually begin to cause headaches. Doctors treat rebound headache by tapering the medication that is being overused. Sometimes your caregiver will gradually substitute a different type of treatment or medication. Stopping may be a challenge. Regularly overusing a medication increases the potential for serious side effects. Consult a caregiver if you regularly use headache medications more than 2 days per week or more than the label advises. ADDITIONAL QUESTIONS AND ANSWERS Q: What is biofeedback? A: Biofeedback is a self-help treatment. Biofeedback uses special equipment to monitor your body's involuntary physical responses. Biofeedback monitors:  Breathing.  Pulse.  Heart rate.  Temperature.  Muscle tension.  Brain activity. Biofeedback helps you refine and perfect your relaxation exercises. You learn to control the physical responses that are  related to stress. Once the technique has been mastered, you do not need the equipment any more. Q: Are headaches hereditary? A: Four out of five (80%) of people  that suffer report a family history of migraine. Scientists are not sure if this is genetic or a family predisposition. Despite the uncertainty, a child has a 50% chance of having migraine if one parent suffers. The child has a 75% chance if both parents suffer.  Q: Can children get headaches? A: By the time they reach high school, most young people have experienced some type of headache. Many safe and effective approaches or medications can prevent a headache from occurring or stop it after it has begun.  Q: What type of doctor should I see to diagnose and treat my headache? A: Start with your primary caregiver. Discuss his or her experience and approach to headaches. Discuss methods of classification, diagnosis, and treatment. Your caregiver may decide to recommend you to a headache specialist, depending upon your symptoms or other physical conditions. Having diabetes, allergies, etc., may require a more comprehensive and inclusive approach to your headache. The National Headache Foundation will provide, upon request, a list of [facility] physician members in your state. Document Released: 12/11/2003 Document Revised: 12/13/2011 Document Reviewed: 05/20/2008 [facility] Patient Information 2014 [place], [place].  Obsessive-Compulsive Disorder Obsessive-compulsive disorder (OCD) is a mental illness. People with OCD have obsessions, compulsions, or both. Obsessions are unwanted and distressing thoughts, ideas, or urges that keep entering your mind. You may find yourself trying to ignore them. You may try to stop or undo them with a compulsion. Compulsions are repetitive physical or mental acts that you feel driven to perform. The actsmay seem senseless or excessive. They usually reduce or prevent any emotional distress. Obsessions and compulsions can be time consuming (take more than 1 hour a day). They can interfere with personal relationships and normal activities at home, school, or work.  OCD usually starts in  young adulthood and continues throughout life. People who have family members with OCD are at higher risk for developing OCD. Women are at higher risk for OCD during and after pregnancy. Many people with OCD also have depression or another mental health disorder. SYMPTOMS  People with obsessions usually have a fear that something terrible will happen or that they will do something terrible. Examples of common obsessions include:   Fear of contamination with germs, bodily waste, or poisonous substances.  Fear of making the wrong decision (self-doubt).  Violent or sexual thoughts or urges towards others.  Need for symmetry or exactness. Examples of typical compulsive acts include:   Excessive hand washing or bathing due to fear of contamination.  Checking things over and over to make sure a task has been completed. For example, making sure a door is locked or a toaster is unplugged.  Repeating an act or phrase over and over, sometimes a specific number of times, until it feels right.  Arranging and rearranging objects to keep them in a certain order. DIAGNOSIS  OCD is diagnosed through an assessment by your health care provider. Your health care provider will ask questions about any obsessions or compulsions you have and how they affect your life. Your health care provider may also ask about your medical history, prescription medicine, and drug use. Certain medical conditions and substances can cause symptoms similar to OCD. TREATMENT  The following forms of treatment are available for OCD:  Cognitive therapy. This is a form of talk therapy. The goal is to identify  and change the irrational thoughts associated with obsessions.  Behavioral therapy. This is also a form of talk therapy. The goal is to target and reduce compulsive acts (behaviors). A specific type of behavioral therapy called exposure and response prevention is considered the most effective treatment for OCD. You will be exposed  to the distressing situation that triggers your compulsion and prevented from responding to it. With repetition of this process over time, you will no longer feel the distress or need to perform the compulsion.   Medicine. Certain types of antidepressant medicine may help reduce or control OCD symptoms. Medication is most effective when it is used with cognitive or behavioral therapy. For severe OCD that does not respond to talk therapy and medication, brain surgery or electrical stimulation of specific areas of the brain (deep brain stimulation) may be helpful. HOME CARE INSTRUCTIONS   Take all medicines as prescribed.  Check with your health care provider before starting new prescription or over-the-counter medicines. SEEK MEDICAL CARE IF:  If you are not able to take your medicines as prescribed.  If your symptoms get worse. SEEK IMMEDIATE MEDICAL CARE IF:  You feel that any of your ideas or actions are slipping out of your control. Document Released: 09/14/2001 Document Revised: 07/11/2013 Document Reviewed: 05/04/2013 [facility] Patient Information 2014 [place], [place].

## 2014-01-02 ENCOUNTER — Other Ambulatory Visit: Payer: Self-pay | Admitting: Emergency Medicine

## 2014-01-03 ENCOUNTER — Telehealth: Payer: Self-pay

## 2014-01-03 NOTE — Telephone Encounter (Signed)
Pt called requesting [facility] note 12/31/13-01/04/14. Note ready for pt to pick up. Pt aware

## 2014-01-29 ENCOUNTER — Other Ambulatory Visit: Payer: Self-pay | Admitting: Physician Assistant

## 2014-01-31 ENCOUNTER — Telehealth: Payer: Self-pay | Admitting: *Deleted

## 2014-01-31 NOTE — Telephone Encounter (Signed)
Patient called and requested note saying patient does not have to wear her seatbelt due to her OCD.  Per Dr Oneta RackMcKeown, he cannot write note because studies prove that seatbelts save lives and he would be responsible if she had an accident.  Patient aware.

## 2014-02-11 ENCOUNTER — Ambulatory Visit: Payer: Self-pay | Admitting: Physician Assistant

## 2014-03-13 ENCOUNTER — Ambulatory Visit: Payer: Self-pay | Admitting: Physician Assistant

## 2014-04-07 ENCOUNTER — Other Ambulatory Visit: Payer: Self-pay | Admitting: Internal Medicine

## 2014-05-01 ENCOUNTER — Encounter: Payer: Self-pay | Admitting: Physician Assistant

## 2014-05-01 ENCOUNTER — Ambulatory Visit (INDEPENDENT_AMBULATORY_CARE_PROVIDER_SITE_OTHER): Payer: Self-pay | Admitting: Physician Assistant

## 2014-05-01 VITALS — BP 100/62 | HR 88 | Temp 98.1°F | Resp 16 | Wt 123.0 lb

## 2014-05-01 DIAGNOSIS — F429 Obsessive-compulsive disorder, unspecified: Secondary | ICD-10-CM

## 2014-05-01 MED ORDER — ALPRAZOLAM 0.5 MG PO TABS: ORAL_TABLET | ORAL | Status: DC

## 2014-05-01 MED ORDER — IMIPRAMINE HCL 50 MG PO TABS: ORAL_TABLET | ORAL | Status: DC

## 2014-05-01 MED ORDER — CITALOPRAM HYDROBROMIDE 20 MG PO TABS: 20.0000 mg | ORAL_TABLET | Freq: Every day | ORAL | Status: DC

## 2014-05-01 NOTE — Progress Notes (Signed)
HPI 33 y.o.female presents for follow up of severe OCD/depression/anxiety. Last visit she was given Celexa 20mg  (1/2 pill) daily but she tried to sanitize the paper prescription and it ripped so the pharmacy would not fill it. She is moving from her apartment and going to be staying with her mom until she can afford to move in with her friend in [place]. [facility] [employer]. For the past month she has had sore throat, runny nose, headaches, it went away and then came back. She started on claritin. She blames the "sickness" on stress, she has been sleeping through her alarm. States she realizes at this time she has a problem and wants help. Denies sucidal or homicidal ideations.   Past Medical History  Diagnosis Date  . Allergy   . Asthma   . Hyperlipidemia   . IBS (irritable bowel syndrome)   . Migraines   . TMJ syndrome   . Depression   . Anxiety   . OCD (obsessive compulsive disorder)      Allergies  Allergen Reactions  . Zoloft [Sertraline Hcl]       Current Outpatient Prescriptions on File Prior to Visit  Medication Sig Dispense Refill  . ALPRAZolam (XANAX) 0.5 MG tablet TAKE 1/2 TO 1 TABLET 3 TIMES A DAY  90 tablet  0  . citalopram (CELEXA) 20 MG tablet Take 1 tablet (20 mg total) by mouth daily. For chronic anxiety and OCD  90 tablet  1  . imipramine (TOFRANIL) 50 MG tablet Take three tablets at bedtime  90 tablet  0  . imipramine (TOFRANIL) 50 MG tablet TAKE 3 TABLETS BY MOUTH AT BEDTIME  90 tablet  2   No current facility-administered medications on file prior to visit.    ROS: all negative expect above.   Physical: Filed Weights   05/01/14 1358  Weight: 123 lb (55.792 kg)   BP 100/62  Pulse 88  Temp(Src) 98.1 F (36.7 C)  Resp 16  Wt 123 lb (55.792 kg) General Appearance: thin, in no apparent distress. Eyes: PERRLA, EOMs. Sinuses: No Frontal/maxillary tenderness ENT/Mouth: Ext aud canals clear, normal light reflex with TMs without erythema,  bulging. Post pharynx without erythema, swelling, exudate.  Respiratory: CTAB Cardio: RRR, no murmurs, rubs or gallops. Peripheral pulses brisk and equal bilaterally, without edema. No aortic or femoral bruits. Abdomen: Soft, with bowl sounds. Nontender, no guarding, rebound. Lymphatics: Non tender without lymphadenopathy.  Musculoskeletal: Full ROM all peripheral extremities, 5/5 strength, and normal gait. Skin: Warm, dry with bilateral hands with erythema, scaling due to use of cleaning products.  Neuro: Cranial nerves intact, reflexes equal bilaterally. Normal muscle tone, no cerebellar symptoms. Sensation intact.  Pysch: Awake and oriented X 3, flat affect, Insight and Judgment improved, she admits to OCD and would like help, still appears anxious  Assessment and Plan: Anxiety/depression/OCD- very long discussion about how she needs to see a psychiatrist due to the severity of her OCD, can go to behavioral health and information given. She is currently willing to try celexa 20mg  Allergic rhinitis- Allegra OTC, increase H20, allergy hygiene explained.

## 2014-05-01 NOTE — Patient Instructions (Addendum)
Behavioral Health Resources in the [facility]  Intensive Outpatient Programs:  [facility]  [address]  [place], [place]  [phone]  Both a day and evening program  [facility]  [address]  [place], [place] [address]  [phone]  [facility]: [facility]  [address]  [place]  [phone]  [facility]  ACCESS LINE: [phone] or [phone]  [address]  [place], [place] [address]  [email or web address]  Substance Abuse Resources:  [facility] [phone]  [facility] [phone]  [facility] [phone]  [facility] [phone]  Residential & Outpatient Substance Abuse Program [phone] Psychological Services:  [facility] [phone]  [facility] [phone]  [facility], [phone] [place]. [address], [place], ACCESS LINE: [phone] or [phone], [email or web address] Mobile Crisis Teams:  [facility]  Mobile Crisis Care Unit  [phone]  Assertive  Psychotherapeutic Services  [address] [place]  [phone]  Interventionist  [address]  [address]  [place]  [phone]  Self-Help/Support Groups:  [facility]ental Health Assoc[facility] of [facility] [place] Variety of support groups  [phone] (call for more info)  Narcotics Anonymous (NA)  [facility]  [address]  [place] - 2 meetings at this location  Residential Treatment Programs:  [facility]  [address]  [place]  [phone]  [facility]  [address], [place] [address]  [place], [place] [address]  [phone]  [facility]  [address]  [place], [place] [address]  [phone]  Admissions: 8am-3pm M-F  [facility]  [address]  [place], [place] [address]  [phone]  [facility]  [address]  [place], [place]  [phone]  [facility]  [address]  [place], [place]  [phone]  [facility] - Intensive Outpatient  [address]  [place], [place]  [phone]  [facility] ([facility]  [address]  [place], [place]  [phone] or [phone]  Residential Treatment Services (RTS), Medicaid  [address]  [place], [place]  [phone]  [facility] [address]  [address]  [place]  [phone]  [facility] Resources:  [phone]- [phone]  General Therapy  Angie FavaJulie Brannon, PhD  [address]  [place], [place] [address]  [phone]  Insurance  [facility]  [address]  [place], [place] [address]  [phone]  [facility]  [address] [place], [place] [address]  [phone]  Insurance/Medicaid/sponsorship through [facility]  Faith and Families  [address]  [place], [place] [address]  Therapy/tele-psych/case  [phone]  [facility]  [address]  [place], [place] [address]  Adolescent/group home/case management  [phone]  Creola CornJulia Brannon PhD  General therapy  Insurance  [phone]  Dr. Lolly MustacheArfeen, [place]urance, M-F  [phone]  [facility]  [address] [place] [address]  [place]  Phone: [phone] Phone: [phone] Phone: [phone]  [facility], [phone]  [facility] - [facility]- [phone] - [facility] in [place], [address],  [phone], [facility]  [place] Child Abuse Hotline  [phone] or [phone] (After Hours)       Obsessive-Compulsive Disorder Obsessive-compulsive disorder (OCD) is a  mental illness. People with OCD have obsessions,  compulsions, or both. Obsessions are unwanted and distressing thoughts, ideas, or urges that keep entering your mind. You may find yourself trying to ignore them. You may try to stop or undo them with a compulsion. Compulsions are repetitive physical or mental acts that you feel driven to perform. The actsmay seem senseless or excessive. They usually reduce or prevent any emotional distress. Obsessions and compulsions can be time consuming (take more than 1 hour a day). They can interfere with personal relationships and normal activities at home, school, or work.  OCD usually starts in young adulthood and continues throughout life. People who have family members with OCD are at higher risk for developing OCD. Women are at higher risk for OCD during and after pregnancy. Many people with OCD also have depression or another mental health disorder. SYMPTOMS  People with obsessions usually have a fear that something terrible will happen or that they will do something terrible. Examples of common obsessions include:   Fear of contamination with germs, bodily waste, or poisonous substances.  Fear of making the wrong decision (self-doubt).  Violent or sexual thoughts or urges towards others.  Need for symmetry or exactness. Examples of typical compulsive acts include:   Excessive hand washing or bathing due to fear of contamination.  Checking things over and over to make sure a task has been completed. For example, making sure a door is locked or a toaster is unplugged.  Repeating an act or phrase over and over, sometimes a specific number of times, until it feels right.  Arranging and rearranging objects to keep them in a certain order. DIAGNOSIS  OCD is diagnosed through an assessment by your health care provider. Your health care provider will ask questions about any obsessions or compulsions you have and how they affect your life. Your health care provider may also ask about your medical history,  prescription medicine, and drug use. Certain medical conditions and substances can cause symptoms similar to OCD. TREATMENT  The following forms of treatment are available for OCD:  Cognitive therapy. This is a form of talk therapy. The goal is to identify and change the irrational thoughts associated with obsessions.  Behavioral therapy. This is also a form of talk therapy. The goal is to target and reduce compulsive acts (behaviors). A specific type of behavioral therapy called exposure and response prevention is considered the most effective treatment for OCD. You will be exposed to the distressing situation that triggers your compulsion and prevented from responding to it. With repetition of this process over time, you will no longer feel the distress or need to perform the compulsion.   Medicine. Certain types of antidepressant medicine may help reduce or control OCD symptoms. Medication is most effective when it is used with cognitive or behavioral therapy. For severe OCD that does not respond to talk therapy and medication, brain surgery or electrical stimulation of specific areas of the brain (deep brain stimulation) may be helpful. HOME CARE INSTRUCTIONS   Take all medicines as prescribed.  Check with your health care provider before starting new prescription or over-the-counter medicines. SEEK MEDICAL CARE IF:  If you are not able to take your medicines as prescribed.  If your symptoms get worse. SEEK IMMEDIATE MEDICAL CARE IF:  You feel that any of your ideas or actions are slipping out of your control. Document Released: 09/14/2001 Document Revised: 09/25/2013 Document Reviewed: 05/04/2013 [facility] Patient Information 2015 [place], [place]. This information is not  intended to replace advice given to you by your health care provider. Make sure you discuss any questions you have with your health care provider.

## 2014-05-30 ENCOUNTER — Ambulatory Visit: Payer: Self-pay | Admitting: Internal Medicine

## 2014-06-05 ENCOUNTER — Other Ambulatory Visit: Payer: Self-pay | Admitting: Physician Assistant

## 2014-07-05 ENCOUNTER — Other Ambulatory Visit: Payer: Self-pay | Admitting: Physician Assistant

## 2014-08-12 ENCOUNTER — Other Ambulatory Visit: Payer: Self-pay | Admitting: Physician Assistant

## 2014-08-13 ENCOUNTER — Other Ambulatory Visit: Payer: Self-pay | Admitting: Physician Assistant

## 2014-09-07 ENCOUNTER — Other Ambulatory Visit: Payer: Self-pay | Admitting: Physician Assistant

## 2014-09-12 ENCOUNTER — Ambulatory Visit: Payer: Self-pay | Admitting: Physician Assistant

## 2014-09-12 ENCOUNTER — Encounter: Payer: Self-pay | Admitting: Physician Assistant

## 2014-09-12 VITALS — BP 100/70 | HR 92 | Temp 98.2°F | Resp 16 | Ht 66.0 in | Wt 128.0 lb

## 2014-09-12 DIAGNOSIS — G43809 Other migraine, not intractable, without status migrainosus: Secondary | ICD-10-CM

## 2014-09-12 DIAGNOSIS — F329 Major depressive disorder, single episode, unspecified: Secondary | ICD-10-CM

## 2014-09-12 DIAGNOSIS — F429 Obsessive-compulsive disorder, unspecified: Secondary | ICD-10-CM

## 2014-09-12 DIAGNOSIS — F419 Anxiety disorder, unspecified: Secondary | ICD-10-CM

## 2014-09-12 DIAGNOSIS — F32A Depression, unspecified: Secondary | ICD-10-CM

## 2014-09-12 DIAGNOSIS — I10 Essential (primary) hypertension: Secondary | ICD-10-CM

## 2014-09-12 MED ORDER — ALPRAZOLAM 0.25 MG PO TABS: ORAL_TABLET | ORAL | Status: DC

## 2014-09-12 MED ORDER — IMIPRAMINE HCL 50 MG PO TABS: ORAL_TABLET | ORAL | Status: DC

## 2014-09-12 NOTE — Progress Notes (Signed)
Subjective:    Patient ID: Makayla LovingsPatricia A [name], female    DOB: 15-Oct-1980, 33 y.o.   MRN: [id number]  HPI A Caucasian 33yo female presents to the office for follow up on OCD, anxiety and depression.  Patient was last seen 05/01/14.  Patient is currently taking Xanax 0.25mg - 1/2 to 1 tablet PO TID and Imipramine 50mg - Take 3 tablets PO at bedtime.  Patient states she needs refills on these two medications and would like to start Celexa if possible.  She states she never picked up the Celexa before.  Patient has long history with OCD and has certain rituals she must perform based on Quentin MullingAmanda Collier, PA-C notes.  Patient would only tell me certain things she must do.  She stated that it all started with her bedroom growing up.  She states she had to keep it clean and do things a certain way.  She then stated that it spread to the rest of the house, until she felt the need that the whole house had to be done.  Patient states she likes everything clean and cleaned a certain way and in a certain order.  Patient states she also must have her own washer and dryer and cannot clean her items in the same machines as others use.    Patient states she cannot be around her mother.  She states her mother says the OCD is not a real disease.  She currently lives with her mother in an apartment due to her grandparents house having to be sold.  Patient works at [employer] and lost her well paying job the same time she had to move out of her grandparents house due to them being deceased and the house being on rented land.  She states she does not have the funds right now for any blood work due to having to pay for medications.     Patient states she also just broke up with her boyfriend due to "him not liking how long it takes for her to move".  Patient does admit to SI and HI thoughts, but does NOT have SI or HI plans.  Patient denies weapons/guns in her home.  Patient states [facility] when she was going  to school there.  She reports stressors like money and her mother.  She states she does want to go back to [name]'s private practice office (counselor), but states it really did not help her.  She states she knows what she has to do.  LMP- 09/12/14.  She states her periods are irregular and the one before December was August 03, 2014.  Headaches- She states when she gets headaches she gets nausea and vomitng and feels like she is going to pass out.  Was seeing headache doctor and was put on Imipramine.  She is also taking Aleve- 2 tablets.  LAST VISIT- 05/01/14 "33 y.o.female presents for follow up of severe OCD/depression/anxiety. Last visit she was given Celexa 20mg  (1/2 pill) daily but she tried to sanitize the paper prescription and it ripped so the pharmacy would not fill it. She is moving from her apartment and going to be staying with her mom until she can afford to move in with her friend in [place]. She is currently at [employer]. For the past month she has had sore throat, runny nose, headaches, it went away and then came back. She started on claritin. She blames the "sickness" on stress, she has been sleeping through her  alarm. States she realizes at this time she has a problem and wants help. Denies sucidal or homicidal ideations."   Review of Systems  Constitutional: Positive for fatigue. Negative for fever, chills and diaphoresis.  HENT: Negative for congestion, ear discharge, ear pain, postnasal drip, rhinorrhea, sinus pressure, sore throat and trouble swallowing.   Eyes: Negative.   Respiratory: Negative.   Cardiovascular: Negative.   Gastrointestinal: Negative.   Genitourinary: Negative.   Musculoskeletal: Negative.   Skin: Negative.  Negative for rash.  Allergic/Immunologic: Positive for environmental allergies.  Neurological: Positive for headaches. Negative for dizziness and light-headedness.  Psychiatric/Behavioral: The patient is nervous/anxious.        OCD,  anxiety, stress and depression.   Has SI and HI thoughts, but NO plans.   Past Medical History  Diagnosis Date  . Allergy   . Asthma   . Hyperlipidemia   . IBS (irritable bowel syndrome)   . Migraines   . TMJ syndrome   . Depression   . Anxiety   . OCD (obsessive compulsive disorder)    Current Outpatient Prescriptions on File Prior to Visit  Medication Sig Dispense Refill  . ALPRAZolam (XANAX) 0.5 MG tablet TAKE 1/2 TO 1 TABLET BY MOUTH 3 TIMES A DAY 90 tablet 0  . imipramine (TOFRANIL) 50 MG tablet TAKE 3 TABLETS BY MOUTH AT BEDTIME 90 tablet 2  . citalopram (CELEXA) 20 MG tablet Take 1 tablet (20 mg total) by mouth daily. For chronic anxiety and OCD (Patient not taking: Reported on 09/12/2014) 30 tablet 1   No current facility-administered medications on file prior to visit.   Allergies  Allergen Reactions  . Zoloft [Sertraline Hcl]      BP 100/70 mmHg  Pulse 92  Temp(Src) 98.2 F (36.8 C) (Temporal)  Resp 16  Ht 5\' 6"  (1.676 m)  Wt 128 lb (58.06 kg)  BMI 20.67 kg/m2  LMP 09/12/2014 Wt Readings from Last 3 Encounters:  09/12/14 128 lb (58.06 kg)  05/01/14 123 lb (55.792 kg)  12/31/13 119 lb (53.978 kg)   Objective:   Physical Exam  Constitutional: She is oriented to person, place, and time. She appears well-developed and well-nourished. She does not have a sickly appearance. No distress.  HENT:  Head: Normocephalic.  Right Ear: Tympanic membrane, external ear and ear canal normal.  Left Ear: Tympanic membrane, external ear and ear canal normal.  Nose: Nose normal. Right sinus exhibits no maxillary sinus tenderness and no frontal sinus tenderness. Left sinus exhibits no maxillary sinus tenderness and no frontal sinus tenderness.  Mouth/Throat: Uvula is midline, oropharynx is clear and moist and mucous membranes are normal. Mucous membranes are not pale and not dry. No trismus in the jaw. No uvula swelling. No oropharyngeal exudate, posterior oropharyngeal edema,  posterior oropharyngeal erythema or tonsillar abscesses.  Eyes: Conjunctivae, EOM and lids are normal. Pupils are equal, round, and reactive to light. Right eye exhibits no discharge. Left eye exhibits no discharge. No scleral icterus.  Neck: Trachea normal, normal range of motion and phonation normal. Neck supple. No tracheal tenderness present. No tracheal deviation present.  Cardiovascular: Normal rate, regular rhythm, S1 normal, S2 normal, normal heart sounds and normal pulses.  Exam reveals no gallop, no distant heart sounds and no friction rub.   No murmur heard. Pulmonary/Chest: Effort normal and breath sounds normal. No stridor. No respiratory distress. She has no decreased breath sounds. She has no wheezes. She has no rhonchi. She has no rales. She exhibits no tenderness.  Abdominal: Soft. Bowel sounds are normal. There is no hepatosplenomegaly. There is no tenderness. There is no rebound and no guarding. No hernia.  Lymphadenopathy:  No tenderness or LAD.  Neurological: She is alert and oriented to person, place, and time. She has normal strength. No cranial nerve deficit or sensory deficit. Gait normal.  Skin: Skin is warm, dry and intact. No rash noted. She is not diaphoretic.  Hands were dry and erythematous bilaterally.  Psychiatric: Judgment normal. Her mood appears anxious. Her affect is not inappropriate. Cognition and memory are normal. She expresses homicidal and suicidal ideation. She expresses no suicidal plans and no homicidal plans.  Patient needed to remove glasses before exam due to OCD.   When asked certain questions about her OCD, patient would not discuss her habits in detail and would go around the subject. Her behavior was little agitated, but with more hesitancy and sometimes withdrawn.  Vitals reviewed.  Assessment & Plan:  1. Depression, Anxiety, OCD (Obsessive Compulsive Disorder), Migraines - Take Xanax as prescribed- ALPRAZolam (XANAX) 0.25 MG tablet; TAKE  1/2-1 TABLET BY MOUTH 3 TIMES A DAY  Dispense: 90 tablet; Refill: 0 - Take Imipramine as prescribed for headaches- imipramine (TOFRANIL) 50 MG tablet; TAKE 3 TABLETS BY MOUTH AT BEDTIME  Dispense: 90 tablet; Refill: 0  CANNOT have any refills unless comes in for office appt with Dr. Oneta RackMcKeown in January 2016. Was given long list of places to see psychiatrist twice.   Told patient she MUST see psychiatrist before next appt in January 2016. Did not prescribe the Celexa due to patient's SI and HI thoughts.  Patient had no plans for the SI and HI thoughts.    Spoke with Quentin MullingAmanda Collier, PA-C and she agreed with treatment plan.  Discussed medication effects and SE's.  Pt agreed to treatment plan. Please schedule follow up appt in January 2016 with Dr. Oneta RackMcKeown.  Siennah Barrasso, Lise AuerJennifer L, PA-C 11:13 AM [facility]

## 2014-09-12 NOTE — Patient Instructions (Addendum)
Please make follow up/Physical for January 2016 with Dr. Oneta RackMcKeown  Need to make appt for January 2016 with us for refills and must see psychiatrist before next visit with us in January 2016.  We are holding off on Celexa for right now.  Recommend that you see [facility] to help manage your OCD, depression, anxiety and stress better. Address: [address]     [place], [place] [address] Phone-[phone]   Behavioral Health Resources in the [facility]  Intensive Outpatient Programs:  [facility]  [address]  [place], [place]  [phone]  Both a day and evening program  [facility]  [address]  [place], [place] [address]  [phone]  ADS: Alcohol & Drug Svcs  [address]  [place]  [phone]  [facility]  ACCESS LINE: [phone] or [phone]  [address]  [place], [place] [address]  EntrepreneurLoan.co.[email or web address].[email or web address]  Substance Abuse Resources:  [facility] [phone]  [facility] [phone]  [facility] [phone]  [facility] [phone]  Residential & Outpatient Substance Abuse Program [phone] Psychological Services:  [facility] [phone]  [facility] [phone]  [facility], [place], [place], ACCESS LINE: [phone] or [phone], EntrepreneurLoan.co.[email or web address].[email or web address]/adult.htm Mobile Crisis Teams:  [facility]  Mobile Crisis Care Unit  [phone]  Assertive  Psychotherapeutic Services  [address] [place]  [phone]  Interventionist  [address]  [address]  [place]  [phone]  Self-Help/Support Groups:  [facility] [place] Variety of support groups  [phone] (call for more info)  Narcotics Anonymous (NA)  Caring Services  [address]  [place] - 2 meetings at this location   Residential Treatment Programs:  [facility]  [address]  [place]  [phone]  [facility]  [address], [place] [address]  [place], [place] [address]  [phone]  [facility]  [address]  [place], [place] [address]  [phone]  Admissions: 8am-3pm M-F  [facility]  [address]  [place], [place] [address]  [phone]  [facility]  [address]  [place], [place]  [phone]  [facility]  [address]  [place], [place]  [phone]  [facility] - Intensive Outpatient  [address]  [place], [place]  [phone]  [facility] ([facility]  [address]  [place], [place]  [phone] or [phone]  Residential Treatment Services (RTS), Medicaid  [address]  [place], [place]  [phone]  [facility] [address]  [address]  [place]  [phone]  [facility] Resources:  [phone]- [phone]  General Therapy  Angie FavaJulie Brannon, PhD  [address]  [place], [place] [address]  [phone]  Insurance  [facility]  [address]  [place], [place] [address]  [phone]  [facility]  [address] [place], [place] [address]  [phone]  Insurance/Medicaid/sponsorship through [facility]  Faith and Families  [address]  [place], [place] [address]  Therapy/tele-psych/case  [phone]  [facility]  [address]  [place], [place] [address]  Adolescent/group home/case management  [phone]  [place] CornJulia Brannon PhD  General therapy  Insurance  [phone]  Dr. Lolly MustacheArfeen, Redwood[place]urance, M-F  [phone]  [facility]  [address]  [address]  [place]  Phone: [phone] Phone: [phone] Phone: [phone]  [facility],  [phone]  [facility]- [phone] - [facility] in [place], [address],  [phone], Insurance  [place] Child Abuse Hotline  [phone] or [phone] (After Hours)

## 2014-10-08 ENCOUNTER — Other Ambulatory Visit: Payer: Self-pay | Admitting: Physician Assistant

## 2014-10-10 ENCOUNTER — Other Ambulatory Visit: Payer: Self-pay | Admitting: Physician Assistant

## 2014-10-11 ENCOUNTER — Other Ambulatory Visit: Payer: Self-pay | Admitting: Physician Assistant

## 2014-10-14 ENCOUNTER — Ambulatory Visit: Payer: Self-pay | Admitting: Internal Medicine

## 2014-10-14 ENCOUNTER — Encounter: Payer: Self-pay | Admitting: Internal Medicine

## 2014-10-14 VITALS — BP 124/76 | HR 84 | Temp 99.4°F | Resp 16 | Ht 67.5 in | Wt 131.4 lb

## 2014-10-14 DIAGNOSIS — F329 Major depressive disorder, single episode, unspecified: Secondary | ICD-10-CM

## 2014-10-14 DIAGNOSIS — F419 Anxiety disorder, unspecified: Secondary | ICD-10-CM

## 2014-10-14 DIAGNOSIS — F429 Obsessive-compulsive disorder, unspecified: Secondary | ICD-10-CM

## 2014-10-14 DIAGNOSIS — F32A Depression, unspecified: Secondary | ICD-10-CM

## 2014-10-14 MED ORDER — IMIPRAMINE HCL 50 MG PO TABS: 150.0000 mg | ORAL_TABLET | Freq: Every day | ORAL | Status: DC

## 2014-10-14 MED ORDER — ALPRAZOLAM 0.25 MG PO TABS: ORAL_TABLET | ORAL | Status: DC

## 2014-10-14 NOTE — Progress Notes (Signed)
   Subjective:    Patient ID: Makayla LovingsPatricia A Gilkey, female    DOB: Feb 18, 1981, 34 y.o.   MRN: [id number]  HPI Patient has hx/o severe OCD and chronic and acute anxiety and begrudgingly presents today only to get med refills. Apparently according to her mother she has great difficulty with interpersonal skills and difficulty holding a job. She has been referred to county counseling services. Her OCD with cleanliness and hygiene has been very limiting due to her germ phobia.  Medication Sig  . ALPRAZolam (XANAX) 0.25 MG tablet TAKE 1/2-1 TAB 3 TIMES A DAY   . imipramine (TOFRANIL) 50 MG tablet TAKE 3 TAB AT BEDTIME   Allergies  Allergen Reactions  . Zoloft [Sertraline Hcl]    Past Medical History  Diagnosis Date  . Allergy   . Asthma   . Hyperlipidemia   . IBS (irritable bowel syndrome)   . Migraines   . TMJ syndrome   . Depression   . Anxiety   . OCD (obsessive compulsive disorder)    Past Surgical History  Procedure Laterality Date  . Tonsillectomy and adenoidectomy     Review of Systems  Neg and non contributory otherwise    Objective:   Physical Exam   BP 124/76 mmHg  Pulse 84  Temp(Src) 99.4 F (37.4 C)  Resp 16  Ht 5' 7.5" (1.715 m)  Wt 131 lb 6.4 oz (59.603 kg)  BMI 20.26 kg/m2   HEENT - Eac's patent. TM's Nl. EOM's full. PERRLA. NasoOroPharynx clear. Neck - supple. Nl Thyroid. Carotids 2+ & No bruits, nodes, JVD Chest - Clear equal BS w/o Rales, rhonchi, wheezes. Cor - Nl HS. RRR w/o sig MGR. PP 1(+). No edema. Abd - No palpable organomegaly, masses or tenderness. BS nl. MS- FROM w/o deformities. Muscle power, tone and bulk Nl. Gait Nl. Neuro - No obvious Cr N abnormalities. Sensory, motor and Cerebellar functions appear Nl w/o focal abnormalities. Psyche - Alert & oriented x 3. Admits obsession with fear of "germs".    Assessment & Plan:   1. OCD (obsessive compulsive disorder)  - Rx Tofranil 50 mg x 3 = 150 mg qhs (#90) x 2 rf - Rx ALPRAZolam (XANAX) 0.25  MG tablet; TAKE 1/2-1 TABLET BY MOUTH 3 TIMES A DAY  Dispense: 90 tablet; Refill: 2  2. Depression   3. Anxiety  - Strongly recommended patient contact [name] for evaluation & therapy

## 2015-01-05 ENCOUNTER — Other Ambulatory Visit: Payer: Self-pay | Admitting: Internal Medicine

## 2015-01-06 ENCOUNTER — Ambulatory Visit (INDEPENDENT_AMBULATORY_CARE_PROVIDER_SITE_OTHER): Payer: Self-pay | Admitting: Internal Medicine

## 2015-01-06 ENCOUNTER — Encounter: Payer: Self-pay | Admitting: Internal Medicine

## 2015-01-06 VITALS — BP 102/60 | HR 90 | Temp 98.6°F | Resp 18 | Ht 67.5 in | Wt 130.0 lb

## 2015-01-06 DIAGNOSIS — R1030 Lower abdominal pain, unspecified: Secondary | ICD-10-CM

## 2015-01-06 DIAGNOSIS — F411 Generalized anxiety disorder: Secondary | ICD-10-CM

## 2015-01-06 MED ORDER — IMIPRAMINE HCL 50 MG PO TABS: 150.0000 mg | ORAL_TABLET | Freq: Every day | ORAL | Status: DC

## 2015-01-06 MED ORDER — ALPRAZOLAM 0.5 MG PO TABS: 0.5000 mg | ORAL_TABLET | Freq: Three times a day (TID) | ORAL | Status: DC | PRN

## 2015-01-06 NOTE — Progress Notes (Signed)
Subjective:    Patient ID: Makayla [name], female    DOB: 07/21/1981, 34 y.o.   MRN: [id number]  Abdominal Pain Associated symptoms include constipation and nausea. Pertinent negatives include no diarrhea, dysuria, fever, frequency, hematuria or vomiting.   Patient is a 34 y.o. Female who presents to the office for follow-up and for evaluation of abdominal pain x 1 week and hot flashes.  Patient reports that she has been having pain in her lower abdomen.  She reports that this pain is constant and is aching and is moderately uncomfortable.  She reports that the pain makes her nervous.  She reports that she has not been having bowel movements regularly.  Her last BM was Friday.  She reports that the stool was difficult to get out.  She reports that she does have a history of IBS.  She is not sure if this is similar to her IBS.  She reports that she hasn't had pain like this for several years.  She also reports that she has been having hot flashes for the past week as well.  She feels hot and flushed and it can last for a couple hours at least.  She reports that they go away on their own.  She does report that she has been under a significant amount of stress in the past week.  No changes in sexual partners,.  No changes in OTC meds.    Patient is on Xanax for anxiety, depression, and OCD.  She does report that she takes xanax 4 tablets per day when she is working.  On the days when she is not working she is taking 1-2 tablets per day.  She has been referred to [facility] in the past but she has not gone.  She did go on the website and talked to them about going in to see them.  She stated that it is going to be $180 for her first visit and she was trying to wait to see if she can go after her tax return comes back.    Review of Systems  Constitutional: Negative for fever, chills and fatigue.  HENT: Negative for congestion, postnasal drip, rhinorrhea, sinus pressure, sneezing and sore throat.   Eyes:  Negative.   Respiratory: Positive for chest tightness and shortness of breath. Negative for cough and wheezing.   Cardiovascular: Negative for chest pain and palpitations.  Gastrointestinal: Positive for nausea, abdominal pain and constipation. Negative for vomiting, diarrhea, blood in stool and anal bleeding.  Endocrine: Positive for heat intolerance.  Genitourinary: Negative for dysuria, urgency, frequency, hematuria and difficulty urinating.       Objective:   Physical Exam  Constitutional: She is oriented to person, place, and time. She appears well-developed and well-nourished. No distress.  HENT:  Head: Normocephalic and atraumatic.  Mouth/Throat: Oropharynx is clear and moist. No oropharyngeal exudate.  Eyes: Conjunctivae and EOM are normal. Pupils are equal, round, and reactive to light. No scleral icterus.  Neck: Normal range of motion. Neck supple. No JVD present. No thyromegaly present.  Cardiovascular: Normal rate, regular rhythm, normal heart sounds and intact distal pulses.  Exam reveals no gallop and no friction rub.   No murmur heard. Pulmonary/Chest: Effort normal and breath sounds normal. No respiratory distress. She has no wheezes. She has no rales. She exhibits no tenderness.  Abdominal: Soft. Normal appearance and bowel sounds are normal. She exhibits no distension and no mass. There is tenderness in the suprapubic area. There is no rigidity,  no rebound, no guarding, no CVA tenderness, no tenderness at McBurney's point and negative Murphy's sign.  Musculoskeletal: Normal range of motion.  Lymphadenopathy:    She has no cervical adenopathy.  Neurological: She is alert and oriented to person, place, and time.  Skin: Skin is warm and dry. She is not diaphoretic.  Psychiatric: Her speech is normal and behavior is normal. Judgment and thought content normal. Her mood appears anxious. Cognition and memory are normal.  Nursing note and vitals reviewed.          Assessment & Plan:    1. Lower abdominal pain -history more consistent with constipation vs. IBS, but she does have some tenderness to palpation in the suprapubic area.  Will check for UTI but will also try some miralax samples.  No evidence for appendicitis.   - Urinalysis, Routine w reflex microscopic - Culture, Urine  2. Generalized anxiety disorder -encouraged to see a counselor. Will try increased dose of xanax in the mean time.  Patient will not get a refill earlier than prescribed.  See note above.  Think this is likely source of hot flashes.    - ALPRAZolam (XANAX) 0.5 MG tablet; Take 1 tablet (0.5 mg total) by mouth 3 (three) times daily as needed for sleep or anxiety.  Dispense: 90 tablet; Refill: 0

## 2015-01-06 NOTE — Patient Instructions (Signed)

## 2015-01-07 LAB — URINE CULTURE
COLONY COUNT: NO GROWTH
Organism ID, Bacteria: NO GROWTH

## 2015-01-07 LAB — URINALYSIS, ROUTINE W REFLEX MICROSCOPIC
Glucose, UA: NEGATIVE mg/dL
HGB URINE DIPSTICK: NEGATIVE
Leukocytes, UA: NEGATIVE
Nitrite: NEGATIVE
Protein, ur: NEGATIVE mg/dL
Specific Gravity, Urine: 1.02 (ref 1.005–1.030)
Urobilinogen, UA: 0.2 mg/dL (ref 0.0–1.0)
pH: 5.5 (ref 5.0–8.0)

## 2015-01-30 ENCOUNTER — Ambulatory Visit: Payer: Self-pay | Admitting: Physician Assistant

## 2015-02-14 ENCOUNTER — Other Ambulatory Visit: Payer: Self-pay | Admitting: Internal Medicine

## 2015-02-17 ENCOUNTER — Other Ambulatory Visit: Payer: Self-pay

## 2015-02-17 DIAGNOSIS — F411 Generalized anxiety disorder: Secondary | ICD-10-CM

## 2015-02-17 MED ORDER — ALPRAZOLAM 0.5 MG PO TABS: 0.5000 mg | ORAL_TABLET | Freq: Three times a day (TID) | ORAL | Status: DC | PRN

## 2015-02-27 ENCOUNTER — Ambulatory Visit (INDEPENDENT_AMBULATORY_CARE_PROVIDER_SITE_OTHER): Payer: Self-pay | Admitting: Physician Assistant

## 2015-02-27 ENCOUNTER — Encounter: Payer: Self-pay | Admitting: Physician Assistant

## 2015-02-27 VITALS — BP 110/60 | HR 88 | Temp 97.7°F | Resp 16 | Ht 67.5 in | Wt 135.0 lb

## 2015-02-27 DIAGNOSIS — J020 Streptococcal pharyngitis: Secondary | ICD-10-CM

## 2015-02-27 DIAGNOSIS — R21 Rash and other nonspecific skin eruption: Secondary | ICD-10-CM

## 2015-02-27 MED ORDER — BENZONATATE 100 MG PO CAPS
200.0000 mg | ORAL_CAPSULE | Freq: Three times a day (TID) | ORAL | Status: DC | PRN
Start: 2015-02-27 — End: 2015-08-14

## 2015-02-27 MED ORDER — PREDNISONE 20 MG PO TABS: ORAL_TABLET | ORAL | Status: DC

## 2015-02-27 MED ORDER — AZITHROMYCIN 250 MG PO TABS: ORAL_TABLET | ORAL | Status: AC

## 2015-02-27 NOTE — Patient Instructions (Signed)
Please take the prednisone to help decrease inflammation and therefore decrease symptoms. Take it it with food to avoid GI upset. It can cause increased energy but on the other hand it can make it hard to sleep at night so please take it AT NIGHT WITH DINNER, it takes 8-12 hours to start working so it will NOT affect your sleeping if you take it at night with your food!!  If you are diabetic it will increase your sugars so decrease carbs and monitor your sugars closely.     Strep Throat Strep throat is an infection of the throat caused by a bacteria named Streptococcus pyogenes. Your health care provider may call the infection streptococcal "tonsillitis" or "pharyngitis" depending on whether there are signs of inflammation in the tonsils or back of the throat. Strep throat is most common in children aged 5-15 years during the cold months of the year, but it can occur in people of any age during any season. This infection is spread from person to person (contagious) through coughing, sneezing, or other close contact. SIGNS AND SYMPTOMS   Fever or chills.  Painful, swollen, red tonsils or throat.  Pain or difficulty when swallowing.  White or yellow spots on the tonsils or throat.  Swollen, tender lymph nodes or "glands" of the neck or under the jaw.  Red rash all over the body (rare). DIAGNOSIS  Many different infections can cause the same symptoms. A test must be done to confirm the diagnosis so the right treatment can be given. A "rapid strep test" can help your health care provider make the diagnosis in a few minutes. If this test is not available, a light swab of the infected area can be used for a throat culture test. If a throat culture test is done, results are usually available in a day or two. TREATMENT  Strep throat is treated with antibiotic medicine. HOME CARE INSTRUCTIONS   Gargle with 1 tsp of salt in 1 cup of warm water, 3-4 times per day or as needed for comfort.  Family  members who also have a sore throat or fever should be tested for strep throat and treated with antibiotics if they have the strep infection.  Make sure everyone in your household washes their hands well.  Do not share food, drinking cups, or personal items that could cause the infection to spread to others.  You may need to eat a soft food diet until your sore throat gets better.  Drink enough water and fluids to keep your urine clear or pale yellow. This will help prevent dehydration.  Get plenty of rest.  Stay home from school, day care, or work until you have been on antibiotics for 24 hours.  Take medicines only as directed by your health care provider.  Take your antibiotic medicine as directed by your health care provider. Finish it even if you start to feel better. SEEK MEDICAL CARE IF:   The glands in your neck continue to enlarge.  You develop a rash, cough, or earache.  You cough up green, yellow-brown, or bloody sputum.  You have pain or discomfort not controlled by medicines.  Your problems seem to be getting worse rather than better.  You have a fever. SEEK IMMEDIATE MEDICAL CARE IF:   You develop any new symptoms such as vomiting, severe headache, stiff or painful neck, chest pain, shortness of breath, or trouble swallowing.  You develop severe throat pain, drooling, or changes in your voice.  You develop  swelling of the neck, or the skin on the neck becomes red and tender.  You develop signs of dehydration, such as fatigue, dry mouth, and decreased urination.  You become increasingly sleepy, or you cannot wake up completely. MAKE SURE YOU:  Understand these instructions.  Will watch your condition.  Will get help right away if you are not doing well or get worse. Document Released: 09/17/2000 Document Revised: 02/04/2014 Document Reviewed: 11/19/2010 [facility] Patient Information 2015 [place], [place]. This information is not intended to replace advice  given to you by your health care provider. Make sure you discuss any questions you have with your health care provider.

## 2015-02-27 NOTE — Progress Notes (Signed)
Subjective:    Patient ID: Makayla [name], female    DOB: 09-Nov-1980, 34 y.o.   MRN: [id number]  HPI 34 y.o. female with history of depression, anxiety presents with sore throat, cough, and rash. She states that last Thursday she woke up with sore throat that developed into a cough. She has had headaches with blurry vision, Monday to wednesday, with nausea without vomiting, + sensativity to light, felt like normal migraine. In addition she has a diffuse rash since May 16 and has been getting worse, on her legs, stomach and arms. Her mother was diagnosed with bed bugs and treated, they have found bed bugs in the patient's bed. Has been trying triamcinolone. Denies fever, chills, neck stiffness, diarrhea, constipation, Ab pain.    Blood pressure 110/60, pulse 88, temperature 97.7 F (36.5 C), resp. rate 16, height 5' 7.5" (1.715 m), weight 135 lb (61.236 kg). Current Outpatient Prescriptions on File Prior to Visit  Medication Sig Dispense Refill  . ALPRAZolam (XANAX) 0.5 MG tablet Take 1 tablet (0.5 mg total) by mouth 3 (three) times daily as needed. 90 tablet 0  . imipramine (TOFRANIL) 50 MG tablet Take 3 tablets (150 mg total) by mouth at bedtime. 90 tablet 1   No current facility-administered medications on file prior to visit.   Past Medical History  Diagnosis Date  . Allergy   . Asthma   . Hyperlipidemia   . IBS (irritable bowel syndrome)   . Migraines   . TMJ syndrome   . Depression   . Anxiety   . OCD (obsessive compulsive disorder)    Review of Systems  Constitutional: Positive for chills. Negative for diaphoresis.  HENT: Positive for congestion, sinus pressure, sore throat and voice change. Negative for dental problem, drooling, ear discharge, ear pain, facial swelling, hearing loss, mouth sores, nosebleeds, postnasal drip, rhinorrhea, sneezing, tinnitus and trouble swallowing.   Respiratory: Positive for cough. Negative for apnea, choking, chest tightness, shortness of  breath, wheezing and stridor.   Cardiovascular: Negative.   Gastrointestinal: Negative.   Musculoskeletal: Negative for myalgias, back pain, joint swelling, arthralgias, gait problem, neck pain and neck stiffness.  Skin: Positive for rash. Negative for color change, pallor and wound.  Neurological: Positive for headaches. Negative for dizziness, facial asymmetry, light-headedness and numbness.       Objective:   Physical Exam  Constitutional: She is oriented to person, place, and time. She appears well-developed and well-nourished. No distress.  HENT:  Head: Normocephalic and atraumatic.  Mouth/Throat: Uvula is midline. Oropharyngeal exudate (right tonsil), posterior oropharyngeal edema and posterior oropharyngeal erythema present. No tonsillar abscesses.  Neck: Neck supple. No Brudzinski's sign and no Kernig's sign noted.  Cardiovascular: Normal rate, regular rhythm and normal heart sounds.  Exam reveals no gallop.   No murmur heard. Pulmonary/Chest: Effort normal and breath sounds normal. She has no wheezes.  Abdominal: Soft. Bowel sounds are normal. There is no tenderness.  Lymphadenopathy:    She has cervical adenopathy.       Right cervical: Posterior cervical adenopathy present.       Left cervical: No posterior cervical adenopathy present.    She has no axillary adenopathy.  Neurological: She is alert and oriented to person, place, and time. No cranial nerve deficit.  Skin: Skin is warm. Rash (diffuse urticarial rash) noted. She is not diaphoretic.       Assessment & Plan:  ? Strep pharyngitis with viral exanthem versus sequela- no meningeal signs- no AB/neck tenderness- Zpak with  prednisone- if not better follow up Tuesday, if worse over the weekend go to the ER.

## 2015-03-10 ENCOUNTER — Other Ambulatory Visit: Payer: Self-pay | Admitting: Internal Medicine

## 2015-03-26 ENCOUNTER — Other Ambulatory Visit: Payer: Self-pay | Admitting: *Deleted

## 2015-03-26 DIAGNOSIS — F411 Generalized anxiety disorder: Secondary | ICD-10-CM

## 2015-03-26 MED ORDER — ALPRAZOLAM 0.5 MG PO TABS: 0.5000 mg | ORAL_TABLET | Freq: Three times a day (TID) | ORAL | Status: DC | PRN

## 2015-05-01 ENCOUNTER — Other Ambulatory Visit: Payer: Self-pay | Admitting: Internal Medicine

## 2015-05-02 ENCOUNTER — Other Ambulatory Visit: Payer: Self-pay | Admitting: Physician Assistant

## 2015-05-02 DIAGNOSIS — F411 Generalized anxiety disorder: Secondary | ICD-10-CM

## 2015-05-02 MED ORDER — ALPRAZOLAM 0.5 MG PO TABS: 0.5000 mg | ORAL_TABLET | Freq: Three times a day (TID) | ORAL | Status: DC | PRN

## 2015-05-09 ENCOUNTER — Other Ambulatory Visit: Payer: Self-pay | Admitting: Physician Assistant

## 2015-07-09 ENCOUNTER — Other Ambulatory Visit: Payer: Self-pay | Admitting: Physician Assistant

## 2015-07-11 ENCOUNTER — Other Ambulatory Visit: Payer: Self-pay | Admitting: Physician Assistant

## 2015-07-11 NOTE — Telephone Encounter (Signed)
Pt aware of Rx being called in and that she needs to schedule an appt in the near future.

## 2015-07-15 ENCOUNTER — Ambulatory Visit: Payer: Self-pay | Admitting: Physician Assistant

## 2015-08-13 ENCOUNTER — Ambulatory Visit: Payer: Self-pay | Admitting: Physician Assistant

## 2015-08-14 ENCOUNTER — Ambulatory Visit (INDEPENDENT_AMBULATORY_CARE_PROVIDER_SITE_OTHER): Payer: Self-pay | Admitting: Physician Assistant

## 2015-08-14 ENCOUNTER — Encounter: Payer: Self-pay | Admitting: Physician Assistant

## 2015-08-14 VITALS — BP 120/70 | HR 87 | Temp 97.9°F | Resp 16 | Ht 67.5 in | Wt 137.0 lb

## 2015-08-14 DIAGNOSIS — F429 Obsessive-compulsive disorder, unspecified: Secondary | ICD-10-CM

## 2015-08-14 DIAGNOSIS — G43809 Other migraine, not intractable, without status migrainosus: Secondary | ICD-10-CM

## 2015-08-14 DIAGNOSIS — F329 Major depressive disorder, single episode, unspecified: Secondary | ICD-10-CM

## 2015-08-14 DIAGNOSIS — F411 Generalized anxiety disorder: Secondary | ICD-10-CM

## 2015-08-14 DIAGNOSIS — F32A Depression, unspecified: Secondary | ICD-10-CM

## 2015-08-14 MED ORDER — ALPRAZOLAM 0.5 MG PO TABS: ORAL_TABLET | ORAL | Status: DC

## 2015-08-14 MED ORDER — IMIPRAMINE HCL 50 MG PO TABS
150.0000 mg | ORAL_TABLET | Freq: Every day | ORAL | Status: DC
Start: 2015-08-14 — End: 2016-01-05

## 2015-08-14 NOTE — Progress Notes (Signed)
Assessment and Plan: Depression/OCD [facility], continue medications.  Continue imipramine and xanax.   Declines labs at this time.   HPI 34 y.o.female presents for follow up for medications.  She has anxiety and depression, she is on imipramine 150mg  at night, and is on 0.5mg  xanax as needed but has been taking it 2 -3 times a day. Not having headaches every day and have decreased frequency.  States that OCD has gotten better but has been having some depression. [facility] but states too expensive for first visit. She has been having increase sleeping, decreased motivation. No SI/HI.  Will be trying to get insurance with her work in the next month.   Past Medical History  Diagnosis Date  . Allergy   . Asthma   . Hyperlipidemia   . IBS (irritable bowel syndrome)   . Migraines   . TMJ syndrome   . Depression   . Anxiety   . OCD (obsessive compulsive disorder)      Allergies  Allergen Reactions  . Zoloft [Sertraline Hcl]     Current Outpatient Prescriptions on File Prior to Visit  Medication Sig Dispense Refill  . ALPRAZolam (XANAX) 0.5 MG tablet TAKE 1 TABLET 3 TIMES A DAY AS NEEDED 90 tablet 1   No current facility-administered medications on file prior to visit.    ROS: all negative except above.   Physical Exam: Filed Weights   08/14/15 1421  Weight: 137 lb (62.143 kg)   BP 120/70 mmHg  Pulse 87  Temp(Src) 97.9 F (36.6 C) (Temporal)  Resp 16  Ht 5' 7.5" (1.715 m)  Wt 137 lb (62.143 kg)  BMI 21.13 kg/m2  SpO2 97%  LMP 08/04/2015 General Appearance: Well nourished, in no apparent distress. Eyes: PERRLA, EOMs, conjunctiva no swelling or erythema Sinuses: No Frontal/maxillary tenderness ENT/Mouth: Ext aud canals clear, TMs without erythema, bulging. No erythema, swelling, or exudate on post pharynx.  Tonsils not swollen or erythematous. Hearing normal.  Neck: Supple, thyroid normal.  Respiratory: Respiratory effort  normal, BS equal bilaterally without rales, rhonchi, wheezing or stridor.  Cardio: RRR with no MRGs. Brisk peripheral pulses without edema.  Abdomen: Soft, + BS.  Non tender, no guarding, rebound, hernias, masses. Lymphatics: Non tender without lymphadenopathy.  Musculoskeletal: Full ROM, 5/5 strength, normal gait.  Skin: Warm, dry without rashes, lesions, ecchymosis.  Neuro: Cranial nerves intact. Normal muscle tone, no cerebellar symptoms. Sensation intact.  Psych: Awake and oriented X 3, normal affect, Insight and Judgment appropriate.     Quentin MullingAmanda Collier, PA-C 2:28 PM [facility]

## 2015-08-14 NOTE — Patient Instructions (Addendum)
Psychological Services:  [facility] [phone]  [facility] [phone]  [facility], [place] [place] [address], [place], ACCESS LINE: [phone] or [phone], [email or web address] Mobile Crisis Teams:  [facility]  Mobile Crisis Care Unit  [phone]  Assertive  Psychotherapeutic Services  [address] [place]  [phone]  Interventionist  [address] De[address]sch  [address]  [place]  [phone]  Self-Help/Support Groups:  [facility] [place] Variety of support groups  [phone] (call for more info)   Fatigue Fatigue is feeling tired all of the time, a lack of energy, or a lack of motivation. Occasional or mild fatigue is often a normal response to activity or life in general. However, long-lasting (chronic) or extreme fatigue may indicate an underlying medical condition. HOME CARE INSTRUCTIONS  Watch your fatigue for any changes. The following actions may help to lessen any discomfort you are feeling:  Talk to your health care provider about how much sleep you need each night. Try to get the required amount every night.  Take medicines only as directed by your health care provider.  Eat a healthy and nutritious diet. Ask your health care provider if you need help changing your diet.  Drink enough fluid to keep your urine clear or pale yellow.  Practice ways of relaxing, such as yoga, meditation, massage therapy, or acupuncture.  Exercise regularly.   Change situations that cause you stress. Try to keep your work and personal routine reasonable.  Do not abuse illegal drugs.  Limit alcohol intake to no more than 1 drink per day for nonpregnant women and 2 drinks per day for men. One drink equals 12 ounces of beer, 5 ounces of wine, or 1 ounces of hard liquor.  Take a multivitamin, if directed by your health care provider. SEEK MEDICAL CARE IF:   Your fatigue does not get  better.  You have a fever.   You have unintentional weight loss or gain.  You have headaches.   You have difficulty:   Falling asleep.  Sleeping throughout the night.  You feel angry, guilty, anxious, or sad.   You are unable to have a bowel movement (constipation).   You skin is dry.   Your legs or another part of your body is swollen.  SEEK IMMEDIATE MEDICAL CARE IF:   You feel confused.   Your vision is blurry.  You feel faint or pass out.   You have a severe headache.   You have severe abdominal, pelvic, or back pain.   You have chest pain, shortness of breath, or an irregular or fast heartbeat.   You are unable to urinate or you urinate less than normal.   You develop abnormal bleeding, such as bleeding from the rectum, vagina, nose, lungs, or nipples.  You vomit blood.   You have thoughts about harming yourself or committing suicide.   You are worried that you might harm someone else.    This information is not intended to replace advice given to you by your health care provider. Make sure you discuss any questions you have with your health care provider.   Document Released: 07/18/2007 Document Revised: 10/11/2014 Document Reviewed: 01/22/2014 Elsevier Interactive Patient Education [employer]2016 Elsevier Inc.

## 2015-10-23 ENCOUNTER — Other Ambulatory Visit: Payer: Self-pay | Admitting: Physician Assistant

## 2015-11-11 ENCOUNTER — Other Ambulatory Visit: Payer: Self-pay | Admitting: Physician Assistant

## 2015-12-02 ENCOUNTER — Other Ambulatory Visit: Payer: Self-pay | Admitting: *Deleted

## 2015-12-02 MED ORDER — ALPRAZOLAM 0.5 MG PO TABS: 0.5000 mg | ORAL_TABLET | Freq: Three times a day (TID) | ORAL | Status: DC | PRN

## 2016-01-05 ENCOUNTER — Other Ambulatory Visit: Payer: Self-pay | Admitting: Physician Assistant

## 2016-01-06 ENCOUNTER — Other Ambulatory Visit: Payer: Self-pay | Admitting: Internal Medicine

## 2016-01-07 NOTE — Telephone Encounter (Signed)
[facility].

## 2016-02-11 ENCOUNTER — Other Ambulatory Visit: Payer: Self-pay | Admitting: Internal Medicine

## 2016-02-11 MED ORDER — ALPRAZOLAM 0.5 MG PO TABS: 0.5000 mg | ORAL_TABLET | Freq: Three times a day (TID) | ORAL | Status: DC | PRN

## 2016-02-12 ENCOUNTER — Other Ambulatory Visit: Payer: Self-pay | Admitting: *Deleted

## 2016-02-12 MED ORDER — ALPRAZOLAM 0.5 MG PO TABS: 0.5000 mg | ORAL_TABLET | Freq: Three times a day (TID) | ORAL | Status: DC | PRN

## 2016-02-16 ENCOUNTER — Ambulatory Visit: Payer: Self-pay | Admitting: Physician Assistant

## 2016-02-24 ENCOUNTER — Encounter: Payer: Self-pay | Admitting: Physician Assistant

## 2016-02-24 ENCOUNTER — Ambulatory Visit (INDEPENDENT_AMBULATORY_CARE_PROVIDER_SITE_OTHER): Payer: Self-pay | Admitting: Physician Assistant

## 2016-02-24 VITALS — BP 110/70 | HR 92 | Temp 97.3°F | Resp 16 | Ht 67.5 in | Wt 133.6 lb

## 2016-02-24 DIAGNOSIS — F429 Obsessive-compulsive disorder, unspecified: Secondary | ICD-10-CM

## 2016-02-24 DIAGNOSIS — Z79899 Other long term (current) drug therapy: Secondary | ICD-10-CM | POA: Insufficient documentation

## 2016-02-24 DIAGNOSIS — E785 Hyperlipidemia, unspecified: Secondary | ICD-10-CM

## 2016-02-24 DIAGNOSIS — E559 Vitamin D deficiency, unspecified: Secondary | ICD-10-CM

## 2016-02-24 MED ORDER — CITALOPRAM HYDROBROMIDE 20 MG PO TABS: 20.0000 mg | ORAL_TABLET | Freq: Every day | ORAL | Status: DC

## 2016-02-24 NOTE — Patient Instructions (Addendum)
Please try 1/2 of the celexa 20mg  = 10 mg a day, do this for 2-4 weeks Please take with food Can increase to 1 pill after 2 weeks if you would like Follow up 4 weeks, no charge for visit.   Add on B12 complex one pill a day, this may turn your urine neon yellow, if it does, take it every other day.  B12 is low end of normal, add sublingual B12. The sublingual is better than the pills because likely you are not absorbing in your intestines well so the sublingual gets absorbed through your mouth and ensures you get the amount you need. Will help with energy, memory/concentration, decrease nerve pain, and help with weight loss. B12 is water soluble vitamin so you can not over dose on it, and anything you do not use will be sent out in your urine.    Vitamin D goal is between 60-80  ADD ON 5000 IU VITAMIN D, CHEAPEST [facility], [facility], [facility]  Please make sure that you are taking your Vitamin D as directed.   It is very important as a natural anti-inflammatory   helping hair, skin, and nails, as well as reducing stroke and heart attack risk.   It helps your bones and helps with mood.  It also decreases numerous cancer risks so please take it as directed.   Low Vit D is associated with a 200-300% higher risk for CANCER   and 200-300% higher risk for HEART   ATTACK  &  STROKE.    .....................................Marland Kitchen.  It is also associated with higher death rate at younger ages,   autoimmune diseases like Rheumatoid arthritis, Lupus, Multiple Sclerosis.     Also many other serious conditions, like depression, Alzheimer's  Dementia, infertility, muscle aches, fatigue, fibromyalgia - just to name a few.  +++++++++++++++++++  Can get liquid vitamin D from [place] OR here [place] at  [facility]   Fatigue Fatigue is feeling tired all of the time, a lack of energy, or a lack of motivation. Occasional or mild fatigue is often a normal response to activity or life in  general. However, long-lasting (chronic) or extreme fatigue may indicate an underlying medical condition. HOME CARE INSTRUCTIONS  Watch your fatigue for any changes. The following actions may help to lessen any discomfort you are feeling:  Talk to your health care provider about how much sleep you need each night. Try to get the required amount every night.  Take medicines only as directed by your health care provider.  Eat a healthy and nutritious diet. Ask your health care provider if you need help changing your diet.  Drink enough fluid to keep your urine clear or pale yellow.  Practice ways of relaxing, such as yoga, meditation, massage therapy, or acupuncture.  Exercise regularly.   Change situations that cause you stress. Try to keep your work and personal routine reasonable.  Do not abuse illegal drugs.  Limit alcohol intake to no more than 1 drink per day for nonpregnant women and 2 drinks per day for men. One drink equals 12 ounces of beer, 5 ounces of wine, or 1 ounces of hard liquor.  Take a multivitamin, if directed by your health care provider. SEEK MEDICAL CARE IF:   Your fatigue does not get better.  You have a fever.   You have unintentional weight loss or gain.  You have headaches.   You have difficulty:   Falling asleep.  Sleeping throughout the night.  You feel angry,  guilty, anxious, or sad.   You are unable to have a bowel movement (constipation).   You skin is dry.   Your legs or another part of your body is swollen.  SEEK IMMEDIATE MEDICAL CARE IF:   You feel confused.   Your vision is blurry.  You feel faint or pass out.   You have a severe headache.   You have severe abdominal, pelvic, or back pain.   You have chest pain, shortness of breath, or an irregular or fast heartbeat.   You are unable to urinate or you urinate less than normal.   You develop abnormal bleeding, such as bleeding from the rectum, vagina,  nose, lungs, or nipples.  You vomit blood.   You have thoughts about harming yourself or committing suicide.   You are worried that you might harm someone else.    This information is not intended to replace advice given to you by your health care provider. Make sure you discuss any questions you have with your health care provider.   Document Released: 07/18/2007 Document Revised: 10/11/2014 Document Reviewed: 01/22/2014 Elsevier Interactive Patient Education [employer].

## 2016-02-24 NOTE — Progress Notes (Signed)
Assessment and Plan: Depression/OCD [facility], continue medications.  Continue imipramine and xanax.  Add b12 Check minimal labs Add celexa 20mg , start 10mg    HPI 35 y.o.female presents for follow up for medications.  She has anxiety and depression, she is on imipramine 150mg  at night, and is on 0.5mg  xanax as needed but has been taking it 2 -3 times a day, always takes it at work and occ on the weekend. She states if she does not take the xanax at work she will feel faint, nausea, sweating and have to take it.  Not having headaches every day and have decreased frequency.  States that OCD has gotten better but has been having some depression. Tried to get with [facility] but states too expensive for first visit. She has been having increase sleeping, decreased motivation. No SI/HI. She states she is constantly tired, she states she will go home, eat and want to go directly to bed, she states she sleeps through the night, has fatigue in the AM.   Past Medical History  Diagnosis Date  . Allergy   . Asthma   . Hyperlipidemia   . IBS (irritable bowel syndrome)   . Migraines   . TMJ syndrome   . Depression   . Anxiety   . OCD (obsessive compulsive disorder)      Allergies  Allergen Reactions  . Zoloft [Sertraline Hcl]     Current Outpatient Prescriptions on File Prior to Visit  Medication Sig Dispense Refill  . ALPRAZolam (XANAX) 0.5 MG tablet Take 1 tablet (0.5 mg total) by mouth 3 (three) times daily as needed. 90 tablet 0  . imipramine (TOFRANIL) 50 MG tablet TAKE 3 TABLETS (150 MG TOTAL) BY MOUTH AT BEDTIME. 90 tablet 0   No current facility-administered medications on file prior to visit.    ROS: all negative except above.   Physical Exam: Filed Weights   02/24/16 1553  Weight: 133 lb 9.6 oz (60.601 kg)   BP 110/70 mmHg  Pulse 92  Temp(Src) 97.3 F (36.3 C) (Temporal)  Resp 16  Ht 5' 7.5" (1.715 m)  Wt 133 lb 9.6 oz (60.601 kg)  BMI 20.60  kg/m2  SpO2 97%  LMP 01/23/2016 General Appearance: Well nourished, in no apparent distress. Eyes: PERRLA, EOMs, conjunctiva no swelling or erythema Sinuses: No Frontal/maxillary tenderness ENT/Mouth: Ext aud canals clear, TMs without erythema, bulging. No erythema, swelling, or exudate on post pharynx.  Tonsils not swollen or erythematous. Hearing normal.  Neck: Supple, thyroid normal.  Respiratory: Respiratory effort normal, BS equal bilaterally without rales, rhonchi, wheezing or stridor.  Cardio: RRR with no MRGs. Brisk peripheral pulses without edema.  Abdomen: Soft, + BS.  Non tender, no guarding, rebound, hernias, masses. Lymphatics: Non tender without lymphadenopathy.  Musculoskeletal: Full ROM, 5/5 strength, normal gait.  Skin: Warm, dry without rashes, lesions, ecchymosis.  Neuro: Cranial nerves intact. Normal muscle tone, no cerebellar symptoms. Sensation intact.  Psych: Awake and oriented X 3, normal affect, Insight and Judgment appropriate.     Quentin MullingAmanda Collier, PA-C 4:03 PM [facility]

## 2016-02-25 LAB — CBC WITH DIFFERENTIAL/PLATELET
BASOS ABS: 59 {cells}/uL (ref 0–200)
Basophils Relative: 1 %
EOS ABS: 177 {cells}/uL (ref 15–500)
Eosinophils Relative: 3 %
HEMATOCRIT: 38.9 % (ref 35.0–45.0)
HEMOGLOBIN: 13 g/dL (ref 11.7–15.5)
LYMPHS ABS: 1298 {cells}/uL (ref 850–3900)
Lymphocytes Relative: 22 %
MCH: 30.8 pg (ref 27.0–33.0)
MCHC: 33.4 g/dL (ref 32.0–36.0)
MCV: 92.2 fL (ref 80.0–100.0)
MPV: 10.6 fL (ref 7.5–12.5)
Monocytes Absolute: 649 cells/uL (ref 200–950)
Monocytes Relative: 11 %
NEUTROS ABS: 3717 {cells}/uL (ref 1500–7800)
NEUTROS PCT: 63 %
Platelets: 177 10*3/uL (ref 140–400)
RBC: 4.22 MIL/uL (ref 3.80–5.10)
RDW: 12.2 % (ref 11.0–15.0)
WBC: 5.9 10*3/uL (ref 3.8–10.8)

## 2016-02-25 LAB — COMPREHENSIVE METABOLIC PANEL
ALBUMIN: 4.2 g/dL (ref 3.6–5.1)
ALK PHOS: 51 U/L (ref 33–115)
ALT: 10 U/L (ref 6–29)
AST: 13 U/L (ref 10–30)
BILIRUBIN TOTAL: 0.3 mg/dL (ref 0.2–1.2)
BUN: 15 mg/dL (ref 7–25)
CALCIUM: 9.1 mg/dL (ref 8.6–10.2)
CO2: 28 mmol/L (ref 20–31)
Chloride: 102 mmol/L (ref 98–110)
Creat: 0.78 mg/dL (ref 0.50–1.10)
GLUCOSE: 75 mg/dL (ref 65–99)
POTASSIUM: 5.1 mmol/L (ref 3.5–5.3)
Sodium: 138 mmol/L (ref 135–146)
Total Protein: 6.4 g/dL (ref 6.1–8.1)

## 2016-02-25 LAB — VITAMIN D 25 HYDROXY (VIT D DEFICIENCY, FRACTURES): Vit D, 25-Hydroxy: 21 ng/mL — ABNORMAL LOW (ref 30–100)

## 2016-03-10 ENCOUNTER — Other Ambulatory Visit: Payer: Self-pay

## 2016-03-10 MED ORDER — CITALOPRAM HYDROBROMIDE 20 MG PO TABS: 20.0000 mg | ORAL_TABLET | Freq: Every day | ORAL | Status: DC

## 2016-03-14 ENCOUNTER — Other Ambulatory Visit: Payer: Self-pay | Admitting: Internal Medicine

## 2016-03-18 ENCOUNTER — Other Ambulatory Visit: Payer: Self-pay

## 2016-03-18 MED ORDER — ALPRAZOLAM 0.5 MG PO TABS: 0.5000 mg | ORAL_TABLET | Freq: Three times a day (TID) | ORAL | Status: DC | PRN

## 2016-03-30 ENCOUNTER — Encounter: Payer: Self-pay | Admitting: Physician Assistant

## 2016-03-30 ENCOUNTER — Ambulatory Visit (INDEPENDENT_AMBULATORY_CARE_PROVIDER_SITE_OTHER): Payer: Self-pay | Admitting: Physician Assistant

## 2016-03-30 VITALS — BP 118/70 | HR 88 | Temp 97.3°F | Resp 16 | Ht 67.5 in | Wt 134.4 lb

## 2016-03-30 DIAGNOSIS — F429 Obsessive-compulsive disorder, unspecified: Secondary | ICD-10-CM

## 2016-03-30 DIAGNOSIS — J0100 Acute maxillary sinusitis, unspecified: Secondary | ICD-10-CM

## 2016-03-30 MED ORDER — FLUOXETINE HCL 20 MG PO CAPS: 20.0000 mg | ORAL_CAPSULE | Freq: Every day | ORAL | Status: DC

## 2016-03-30 NOTE — Progress Notes (Signed)
Assessment and Plan: Depression/anxiety- prozac 20mg  daily, in addition to the other, strongly encourage counseling, explained to patient if unable to see counselor will have to dismiss from practice.  If any SI/HI go to ER Allergies- get on allergy pill, flonase, and if not better by friday will send in antibiotic.    HPI 35 y.o.female presents for 1 month follow up for OCD/depression. Suppose to start on celexa 10mg  and look into counseling, accidentally took 20mg  starting and states she had some paranoia and worsening symptoms.  Patient also complains  of possible sinusitis. Symptoms include congestion, low grade fever, nasal congestion and post nasal drip. Onset of symptoms was 7 days ago, and has been gradually worsening since that time. Treatment to date: dayquil and tessalon today. She is not on allergy pill.   Spoke with the mother, she states that she has severe OCD, walking back and forth to bathroom and kitchen washing hands.    Past Medical History  Diagnosis Date  . Allergy   . Asthma   . Hyperlipidemia   . IBS (irritable bowel syndrome)   . Migraines   . TMJ syndrome   . Depression   . Anxiety   . OCD (obsessive compulsive disorder)      Allergies  Allergen Reactions  . Zoloft [Sertraline Hcl]       Current Outpatient Prescriptions on File Prior to Visit  Medication Sig Dispense Refill  . ALPRAZolam (XANAX) 0.5 MG tablet Take 1 tablet (0.5 mg total) by mouth 3 (three) times daily as needed. 90 tablet 0  . citalopram (CELEXA) 20 MG tablet Take 1 tablet (20 mg total) by mouth daily. 30 tablet 2  . imipramine (TOFRANIL) 50 MG tablet TAKE 3 TABLETS (150 MG TOTAL) BY MOUTH AT BEDTIME. 90 tablet 0   No current facility-administered medications on file prior to visit.    ROS: all negative except above.   Physical Exam: Filed Weights   03/30/16 1533  Weight: 134 lb 6.4 oz (60.963 kg)   BP 118/70 mmHg  Pulse 88  Temp(Src) 97.3 F (36.3 C) (Temporal)  Resp 16   Ht 5' 7.5" (1.715 m)  Wt 134 lb 6.4 oz (60.963 kg)  BMI 20.73 kg/m2  SpO2 98%  LMP 03/25/2016 General Appearance: Well nourished, in no apparent distress. Eyes: PERRLA, EOMs, conjunctiva no swelling or erythema Sinuses: No Frontal/maxillary tenderness ENT/Mouth: Ext aud canals clear, TMs without erythema, bulging. No erythema, swelling, or exudate on post pharynx.  Tonsils not swollen or erythematous. Hearing normal.  Neck: Supple, thyroid normal.  Respiratory: Respiratory effort normal, BS equal bilaterally without rales, rhonchi, wheezing or stridor.  Cardio: RRR with no MRGs. Brisk peripheral pulses without edema.  Abdomen: Soft, + BS.  Non tender, no guarding, rebound, hernias, masses. Lymphatics: Non tender without lymphadenopathy.  Musculoskeletal: Full ROM, 5/5 strength, normal gait.  Skin: Warm, dry without rashes, lesions, ecchymosis.  Neuro: Cranial nerves intact. Normal muscle tone, no cerebellar symptoms. Sensation intact.  Psych: Awake and oriented X 3, normal affect, Insight and Judgment appropriate.     Quentin MullingAmanda Tammi Boulier, PA-C 3:45 PM [facility]

## 2016-03-30 NOTE — Patient Instructions (Signed)
Get on allergy pill, get on nasal spray, if not better can call the office before Friday for an antibiotic  Take celexa 1/2 pill daily for 1-4 weeks.  If you feel    Here are things you can do to help with this: - Try the Flonase or Nasonex. Remember to spray each nostril twice towards the outer part of your eye.  Do not sniff but instead pinch your nose and tilt your head back to help the medicine get into your sinuses.  The best time to do this is at bedtime.Stop if you get blurred vision or nose bleeds.  -While drinking fluids, pinch and hold nose close and swallow, to help open eustachian tubes to drain fluid behind ear drums. -Please pick one of the over the counter allergy medications below and take it once daily for allergies.  It will also help with fluid behind ear drums. Claritin or loratadine cheapest but likely the weakest  Zyrtec or certizine at night because it can make you sleepy The strongest is allegra or fexafinadine  Cheapest at [facility], [facility], [facility] -can use decongestant over the counter, please do not use if you have high blood pressure or certain heart conditions.   if worsening HA, changes vision/speech, imbalance, weakness go to the ER

## 2016-04-01 ENCOUNTER — Other Ambulatory Visit: Payer: Self-pay | Admitting: Physician Assistant

## 2016-04-01 MED ORDER — AZITHROMYCIN 250 MG PO TABS: ORAL_TABLET | ORAL | Status: AC

## 2016-04-15 ENCOUNTER — Other Ambulatory Visit: Payer: Self-pay | Admitting: Physician Assistant

## 2016-05-03 ENCOUNTER — Other Ambulatory Visit: Payer: Self-pay | Admitting: Physician Assistant

## 2016-05-03 MED ORDER — ALPRAZOLAM 0.5 MG PO TABS: 0.5000 mg | ORAL_TABLET | Freq: Three times a day (TID) | ORAL | 1 refills | Status: DC | PRN

## 2016-05-03 NOTE — Progress Notes (Signed)
Rx called in to pharmacy. 

## 2016-05-20 ENCOUNTER — Other Ambulatory Visit: Payer: Self-pay | Admitting: Internal Medicine

## 2016-06-11 ENCOUNTER — Other Ambulatory Visit: Payer: Self-pay | Admitting: Physician Assistant

## 2016-06-11 MED ORDER — ALPRAZOLAM 0.5 MG PO TABS: 0.5000 mg | ORAL_TABLET | Freq: Three times a day (TID) | ORAL | 0 refills | Status: DC | PRN

## 2016-06-18 ENCOUNTER — Other Ambulatory Visit: Payer: Self-pay | Admitting: Internal Medicine

## 2016-06-25 ENCOUNTER — Ambulatory Visit: Payer: Self-pay | Admitting: Physician Assistant

## 2016-07-01 ENCOUNTER — Ambulatory Visit: Payer: Self-pay | Admitting: Physician Assistant

## 2016-07-19 ENCOUNTER — Other Ambulatory Visit: Payer: Self-pay | Admitting: Physician Assistant

## 2016-07-19 MED ORDER — ALPRAZOLAM 0.5 MG PO TABS: 0.5000 mg | ORAL_TABLET | Freq: Three times a day (TID) | ORAL | 0 refills | Status: DC | PRN

## 2016-07-22 ENCOUNTER — Other Ambulatory Visit: Payer: Self-pay | Admitting: Physician Assistant

## 2016-07-26 ENCOUNTER — Ambulatory Visit: Payer: Self-pay | Admitting: Physician Assistant

## 2016-07-29 ENCOUNTER — Other Ambulatory Visit: Payer: Self-pay | Admitting: Physician Assistant

## 2016-08-03 ENCOUNTER — Ambulatory Visit: Payer: Self-pay | Admitting: Physician Assistant

## 2016-08-04 ENCOUNTER — Ambulatory Visit (INDEPENDENT_AMBULATORY_CARE_PROVIDER_SITE_OTHER): Payer: Self-pay | Admitting: Physician Assistant

## 2016-08-04 ENCOUNTER — Encounter: Payer: Self-pay | Admitting: Physician Assistant

## 2016-08-04 ENCOUNTER — Ambulatory Visit: Payer: Self-pay | Admitting: Physician Assistant

## 2016-08-04 ENCOUNTER — Ambulatory Visit: Payer: Self-pay | Admitting: Internal Medicine

## 2016-08-04 VITALS — BP 120/62 | HR 110 | Temp 97.5°F | Resp 14 | Ht 67.5 in | Wt 124.2 lb

## 2016-08-04 DIAGNOSIS — F429 Obsessive-compulsive disorder, unspecified: Secondary | ICD-10-CM

## 2016-08-04 DIAGNOSIS — F339 Major depressive disorder, recurrent, unspecified: Secondary | ICD-10-CM

## 2016-08-04 MED ORDER — FLUOXETINE HCL 20 MG PO CAPS: 20.0000 mg | ORAL_CAPSULE | Freq: Every day | ORAL | 2 refills | Status: DC

## 2016-08-04 NOTE — Progress Notes (Signed)
Assessment and Plan: Depression/OCD [facility] Will try to see Dr. Julienne KassBare Get on prozac 20mg  Continue imipramine and xanax. If takes more than 3 xanax a day we will stop prescribing it  Add b12    HPI 35 y.o.female presents for follow up for medications.  She has anxiety and depression, she is on imipramine 150mg  at night, and is on 0.5mg  xanax as needed but has been taking it 2 -3 times a day. Last visit she tried to start the celexa and give it an actual chance, has been on it for 2 month but states she has not been helping.   She has OCD and depression, is doing group counseling but getting car fixed but has not been going. She is doing better at going out/doing things.    Past Medical History:  Diagnosis Date  . Allergy   . Anxiety   . Asthma   . Depression   . Hyperlipidemia   . IBS (irritable bowel syndrome)   . Migraines   . OCD (obsessive compulsive disorder)   . TMJ syndrome      Allergies  Allergen Reactions  . Zoloft [Sertraline Hcl]     Current Outpatient Prescriptions on File Prior to Visit  Medication Sig Dispense Refill  . ALPRAZolam (XANAX) 0.5 MG tablet Take 1 tablet (0.5 mg total) by mouth 3 (three) times daily as needed. 90 tablet 0  . imipramine (TOFRANIL) 50 MG tablet Take 3 tablets (150 mg total) by mouth at bedtime. 270 tablet 1  . FLUoxetine (PROZAC) 20 MG capsule Take 1 capsule (20 mg total) by mouth daily. (Patient not taking: Reported on 08/04/2016) 30 capsule 2   No current facility-administered medications on file prior to visit.     ROS: all negative except above.   Physical Exam: Filed Weights   08/04/16 1135  Weight: 124 lb 3.2 oz (56.3 kg)   BP 120/62   Pulse (!) 110   Temp 97.5 F (36.4 C)   Resp 14   Ht 5' 7.5" (1.715 m)   Wt 124 lb 3.2 oz (56.3 kg)   LMP 07/20/2016   SpO2 99%   BMI 19.17 kg/m  General Appearance: Well nourished, in no apparent distress. Eyes: PERRLA, EOMs, conjunctiva no swelling  or erythema Sinuses: No Frontal/maxillary tenderness ENT/Mouth: Ext aud canals clear, TMs without erythema, bulging. No erythema, swelling, or exudate on post pharynx.  Tonsils not swollen or erythematous. Hearing normal.  Neck: Supple, thyroid normal.  Respiratory: Respiratory effort normal, BS equal bilaterally without rales, rhonchi, wheezing or stridor.  Cardio: RRR with no MRGs. Brisk peripheral pulses without edema.  Abdomen: Soft, + BS.  Non tender, no guarding, rebound, hernias, masses. Lymphatics: Non tender without lymphadenopathy.  Musculoskeletal: Full ROM, 5/5 strength, normal gait.  Skin: Warm, dry without rashes, lesions, ecchymosis.  Neuro: Cranial nerves intact. Normal muscle tone, no cerebellar symptoms. Sensation intact.  Psych: Awake and oriented X 3, normal affect, Insight and Judgment appropriate.     Quentin MullingAmanda Rajiv Parlato, PA-C 11:55 AM [facility]

## 2016-08-04 NOTE — Patient Instructions (Addendum)
B12 is low end of normal, add sublingual B12. The sublingual is better than the pills because likely you are not absorbing in your intestines well so the sublingual gets absorbed through your mouth and ensures you get the amount you need. Will help with energy, memory/concentration, decrease nerve pain, and help with weight loss. B12 is water soluble vitamin so you can not over dose on it, and anything you do not use will be sent out in your urine.    Fibrocystic Breast Changes Fibrocystic breast changes occur when breast ducts become blocked, causing painful, fluid-filled lumps (cysts) to form in the breast. This is a common condition that is noncancerous (benign). It occurs when women go through hormonal changes during their menstrual cycle. Fibrocystic breast changes can affect one or both breasts. CAUSES  The exact cause of fibrocystic breast changes is not known, but it may be related to the female hormones estrogen and progesterone. Family traits that get passed from parent to child (genetics) may also be a factor in some cases. SIGNS AND SYMPTOMS   Tenderness, mild discomfort, or pain.   Swelling.   Rope-like feeling when touching the breast.   Lumpy breast, one or both sides.   Changes in breast size, especially before (larger) and after (smaller) the menstrual period.   Green or dark brown nipple discharge (not blood).  Symptoms are usually worse before menstrual periods start and get better toward the end of the menstrual period.  DIAGNOSIS  To make a diagnosis, your health care provider will ask you questions and perform a physical exam of your breasts. The health care provider may recommend other tests that can examine inside your breasts, such as:  A breast X-ray (mammogram).   Ultrasonography.  An MRI.  If something more than fibrocystic breast changes is suspected, your health care provider may take a breast tissue sample (breast biopsy) to examine. TREATMENT    Often, treatment is not needed. Your health care provider may recommend over-the-counter pain relievers to help lessen pain or discomfort caused by the fibrocystic breast changes. You may also be asked to change your diet to limit or stop eating foods or drinking beverages that contain caffeine. Foods and beverages that contain caffeine include chocolate, soda, coffee, and tea. Reducing sugar and fat in your diet may also help. Your health care provider may also recommend:  Fine needle aspiration to remove fluid from a cyst that is causing pain.   Surgery to remove a large, persistent, and tender cyst. HOME CARE INSTRUCTIONS   Examine your breasts after every menstrual period. If you do not have menstrual periods, check your breasts the first day of every month. Feel for changes, such as more tenderness, a new growth, a change in breast size, or a change in a lump that has always been there.   Only take over-the-counter or prescription medicine as directed by your health care provider.   Wear a well-fitted support or sports bra, especially when exercising.   Decrease or avoid caffeine, fat, and sugar in your diet as directed by your health care provider.  SEEK MEDICAL CARE IF:   You have fluid leaking (discharge) from your nipples, especially bloody discharge.   You have new lumps or bumps in the breast.   Your breast or breasts become enlarged, red, and painful.   You have areas of your breast that pucker in.   Your nipples appear flat or indented.    This information is not intended to replace advice  given to you by your health care provider. Make sure you discuss any questions you have with your health care provider.   Document Released: 07/07/2006 Document Revised: 06/11/2015 Document Reviewed: 03/11/2013 Elsevier Interactive Patient Education [employer]2016 Elsevier Inc.

## 2016-08-25 ENCOUNTER — Other Ambulatory Visit: Payer: Self-pay | Admitting: Physician Assistant

## 2016-08-25 MED ORDER — ALPRAZOLAM 0.5 MG PO TABS: 0.5000 mg | ORAL_TABLET | Freq: Three times a day (TID) | ORAL | 0 refills | Status: DC | PRN

## 2016-09-22 ENCOUNTER — Other Ambulatory Visit: Payer: Self-pay | Admitting: Physician Assistant

## 2016-09-22 NOTE — Telephone Encounter (Signed)
XANAX WAS CALLED INTO PHARMACY & INFORMED PHARMACY NOT TO FILL UNTIL 30TH DEC. Lvm FOR PT TO RETURN OFFICE CALL SO THAT I COULD INFORM HER THAT SHE SHOULD ONLY TAKE AS NEEDED NOT EVERYDAY

## 2016-09-30 ENCOUNTER — Other Ambulatory Visit: Payer: Self-pay | Admitting: Physician Assistant

## 2016-10-13 ENCOUNTER — Ambulatory Visit: Payer: Self-pay | Admitting: Physician Assistant

## 2016-11-01 ENCOUNTER — Ambulatory Visit: Payer: Self-pay | Admitting: Physician Assistant

## 2016-11-16 ENCOUNTER — Other Ambulatory Visit: Payer: Self-pay | Admitting: Physician Assistant

## 2016-11-17 NOTE — Telephone Encounter (Signed)
XANAX WAS CALLED INTO PHARMACY @ 8:30AM ON 14TH FEB 2018 BY DD

## 2016-12-01 ENCOUNTER — Ambulatory Visit: Payer: Self-pay | Admitting: Physician Assistant

## 2016-12-06 ENCOUNTER — Ambulatory Visit: Payer: Self-pay | Admitting: Physician Assistant

## 2016-12-10 ENCOUNTER — Ambulatory Visit: Payer: Self-pay | Admitting: Physician Assistant

## 2016-12-13 ENCOUNTER — Encounter: Payer: Self-pay | Admitting: Physician Assistant

## 2016-12-13 ENCOUNTER — Ambulatory Visit (INDEPENDENT_AMBULATORY_CARE_PROVIDER_SITE_OTHER): Payer: Self-pay | Admitting: Physician Assistant

## 2016-12-13 VITALS — BP 102/60 | HR 102 | Temp 97.7°F | Resp 14 | Ht 67.5 in | Wt 126.0 lb

## 2016-12-13 DIAGNOSIS — F339 Major depressive disorder, recurrent, unspecified: Secondary | ICD-10-CM

## 2016-12-13 DIAGNOSIS — F429 Obsessive-compulsive disorder, unspecified: Secondary | ICD-10-CM

## 2016-12-13 MED ORDER — ALPRAZOLAM 0.5 MG PO TABS: 0.5000 mg | ORAL_TABLET | Freq: Three times a day (TID) | ORAL | 2 refills | Status: DC | PRN

## 2016-12-13 NOTE — Progress Notes (Signed)
   Subjective:    Patient ID: Makayla LovingsPatricia A Stenner, female    DOB: Jul 11, 1981, 36 y.o.   MRN: [id number]  HPI 36 y.o. WF presents for follow up with anxiety.  Mom had MI in Jan, had double bypass, so having to help out more with her at the house. Her mom was very anxious, she has been trying to stay with her and help more.   Blood pressure 102/60, pulse (!) 102, temperature 97.7 F (36.5 C), resp. rate 14, height 5' 7.5" (1.715 m), weight 126 lb (57.2 kg), SpO2 99 %.  Medications Current Outpatient Prescriptions on File Prior to Visit  Medication Sig  . ALPRAZolam (XANAX) 0.5 MG tablet TAKE ONE TABLET BY MOUTH THREE TIMES A DAY AS NEEDED  . FLUoxetine (PROZAC) 20 MG capsule Take 1 capsule (20 mg total) by mouth daily.  Marland Kitchen. imipramine (TOFRANIL) 50 MG tablet Take 3 tablets (150 mg total) by mouth at bedtime.   No current facility-administered medications on file prior to visit.     Problem list She has Allergy; Asthma; Hyperlipidemia; IBS (irritable bowel syndrome); Migraines; TMJ syndrome; Depression; Anxiety; OCD (obsessive compulsive disorder); and Medication management on her problem list.   Review of Systems  Constitutional: Positive for fatigue. Negative for chills, diaphoresis and fever.  HENT: Negative for congestion, ear discharge, ear pain, postnasal drip, rhinorrhea, sinus pressure, sore throat and trouble swallowing.   Eyes: Negative.   Respiratory: Negative.   Cardiovascular: Negative.   Gastrointestinal: Negative.   Genitourinary: Negative.   Musculoskeletal: Negative.   Skin: Negative.  Negative for rash.  Allergic/Immunologic: Positive for environmental allergies.  Neurological: Positive for headaches. Negative for dizziness and light-headedness.  Psychiatric/Behavioral: The patient is nervous/anxious.        OCD, anxiety, stress and depression.         Objective:   Physical Exam  Constitutional: She is oriented to person, place, and time. She appears  well-developed and well-nourished.  HENT:  Head: Normocephalic and atraumatic.  Right Ear: External ear normal.  Left Ear: External ear normal.  Mouth/Throat: Oropharynx is clear and moist.  Eyes: Conjunctivae and EOM are normal. Pupils are equal, round, and reactive to light.  Neck: Normal range of motion. Neck supple. No thyromegaly present.  Cardiovascular: Normal rate, regular rhythm and normal heart sounds.  Exam reveals no gallop and no friction rub.   No murmur heard. Pulmonary/Chest: Effort normal and breath sounds normal. No respiratory distress. She has no wheezes.  Abdominal: Soft. Bowel sounds are normal. She exhibits no distension and no mass. There is no tenderness. There is no rebound and no guarding.  Musculoskeletal: Normal range of motion.  Lymphadenopathy:    She has no cervical adenopathy.  Neurological: She is alert and oriented to person, place, and time. She displays normal reflexes. No cranial nerve deficit. Coordination normal.  Skin: Skin is warm and dry.  Psychiatric: She has a normal mood and affect.       Assessment & Plan:  Depression/anxiety- prozac 20mg  daily, in addition to the other, strongly encourage counseling, explained to patient if unable to see counselor will have to dismiss from practice.  If any SI/HI go to ER Discussed while she wants to help her mom, she can not help her mom if she does not help herself, she will resume her group therapy.   Allergies- get on allergy pill, flonase

## 2016-12-13 NOTE — Patient Instructions (Signed)

## 2017-01-03 ENCOUNTER — Other Ambulatory Visit: Payer: Self-pay

## 2017-01-03 MED ORDER — IMIPRAMINE HCL 50 MG PO TABS: 150.0000 mg | ORAL_TABLET | Freq: Every day | ORAL | 1 refills | Status: DC

## 2017-04-13 ENCOUNTER — Ambulatory Visit (INDEPENDENT_AMBULATORY_CARE_PROVIDER_SITE_OTHER): Payer: Self-pay | Admitting: Physician Assistant

## 2017-04-13 ENCOUNTER — Ambulatory Visit: Payer: Self-pay | Admitting: Physician Assistant

## 2017-04-13 ENCOUNTER — Encounter: Payer: Self-pay | Admitting: Physician Assistant

## 2017-04-13 VITALS — BP 100/78 | HR 102 | Temp 97.3°F | Resp 16 | Ht 67.5 in | Wt 123.4 lb

## 2017-04-13 DIAGNOSIS — F429 Obsessive-compulsive disorder, unspecified: Secondary | ICD-10-CM

## 2017-04-13 DIAGNOSIS — G43809 Other migraine, not intractable, without status migrainosus: Secondary | ICD-10-CM

## 2017-04-13 DIAGNOSIS — F339 Major depressive disorder, recurrent, unspecified: Secondary | ICD-10-CM

## 2017-04-13 MED ORDER — KETOROLAC TROMETHAMINE 60 MG/2ML IM SOLN
60.0000 mg | Freq: Once | INTRAMUSCULAR | Status: AC
  Administered 2017-04-13: 60 mg via INTRAMUSCULAR

## 2017-04-13 MED ORDER — PROMETHAZINE HCL 25 MG PO TABS: 25.0000 mg | ORAL_TABLET | Freq: Four times a day (QID) | ORAL | 0 refills | Status: DC | PRN

## 2017-04-13 MED ORDER — CITALOPRAM HYDROBROMIDE 20 MG PO TABS: 20.0000 mg | ORAL_TABLET | Freq: Every day | ORAL | 2 refills | Status: DC

## 2017-04-13 MED ORDER — CYCLOBENZAPRINE HCL 10 MG PO TABS: 10.0000 mg | ORAL_TABLET | Freq: Three times a day (TID) | ORAL | 0 refills | Status: DC | PRN

## 2017-04-13 MED ORDER — PROMETHAZINE HCL 25 MG/ML IJ SOLN
25.0000 mg | Freq: Once | INTRAMUSCULAR | Status: DC
Start: 2017-04-13 — End: 2017-04-13

## 2017-04-13 NOTE — Patient Instructions (Addendum)
Need to see a psychiatrist or will dismiss from the practice after 30 days   To prevent migraines you can try these natural/OTC medications as prevention: 1) Melatonin 5mg -15mg  30 mins before bed 2) Riboflavin/B2 400mg  daily 3) CoQ10 100mg  3 x a day  We may also treat TMJ if we think you have it If you are having frequent migraines we may put you on a once a day medication with fast acting medication to take. Here is more information below  Please remember, common headache triggers are: sleep deprivation, dehydration, overheating, stress, hypoglycemia or skipping meals and blood sugar fluctuations, excessive pain medications or excessive alcohol use or caffeine withdrawal. Some people have food triggers such as aged cheese, orange juice or chocolate, especially dark chocolate, or MSG (monosodium glutamate). Try to avoid these headache triggers as much possible. It may be helpful to keep a headache diary to figure out what makes your headaches worse or brings them on and what alleviates them. Some people report headache onset after exercise but studies have shown that regular exercise may actually prevent headaches from coming. If you have exercise-induced headaches, please make sure that you drink plenty of fluid before and after exercising and that you do not over do it and do not overheat.    Migraine Headache A migraine headache is an intense, throbbing pain on one or both sides of your head. Recurrent migraines keep coming back. A migraine can last for 30 minutes to several hours. CAUSES  The exact cause of a migraine headache is not always known. However, a migraine may be caused when nerves in the brain become irritated and release chemicals that cause inflammation. This causes pain. Certain things may also trigger migraines, such as:   Alcohol.  Smoking.  Stress.  Menstruation.  Aged cheeses.  Foods or drinks that contain nitrates, glutamate, aspartame, or tyramine.  Lack of  sleep.  Chocolate.  Caffeine.  Hunger.  Physical exertion.  Fatigue.  Medicines used to treat chest pain (nitroglycerine), birth control pills, estrogen, and some blood pressure medicines. SYMPTOMS   Pain on one or both sides of your head.  Pulsating or throbbing pain.  Severe pain that prevents daily activities.  Pain that is aggravated by any physical activity.  Nausea, vomiting, or both.  Dizziness.  Pain with exposure to bright lights, loud noises, or activity.  General sensitivity to bright lights, loud noises, or smells. Before you get a migraine, you may get warning signs that a migraine is coming (aura). An aura may include:  Seeing flashing lights.  Seeing bright spots, halos, or zigzag lines.  Having tunnel vision or blurred vision.  Having feelings of numbness or tingling.  Having trouble talking.  Having muscle weakness. DIAGNOSIS  A recurrent migraine headache is often diagnosed based on:  Symptoms.  Physical examination.  A CT scan or MRI of your head. These imaging tests cannot diagnose migraines but can help rule out other causes of headaches.  TREATMENT  Medicines may be given for pain and nausea. Medicines can also be given to help prevent recurrent migraines. HOME CARE INSTRUCTIONS  Only take over-the-counter or prescription medicines for pain or discomfort as directed by your health care provider. The use of long-term narcotics is not recommended.  Lie down in a dark, quiet room when you have a migraine.  Keep a journal to find out what may trigger your migraine headaches. For example, write down:  What you eat and drink.  How much sleep you get.  Any change to your diet or medicines.  Limit alcohol consumption.  Quit smoking if you smoke.  Get 7-9 hours of sleep, or as recommended by your health care provider.  Limit stress.  Keep lights dim if bright lights bother you and make your migraines worse. SEEK MEDICAL CARE  IF:   You do not get relief from the medicines given to you.  You have a recurrence of pain.  You have a fever. SEEK IMMEDIATE MEDICAL CARE IF:  Your migraine becomes severe.  You have a stiff neck.  You have loss of vision.  You have muscular weakness or loss of muscle control.  You start losing your balance or have trouble walking.  You feel faint or pass out. You have severe symptoms that are different from your first symptoms. MAKE SURE YOU:   Understand these instructions.  Will watch your condition.  Will get help right away if you are not doing well or get worse.   This information is not intended to replace advice given to you by your health care provider. Make sure you discuss any questions you have with your health care provider.   Document Released: 06/15/2001 Document Revised: 10/11/2014 Document Reviewed: 05/28/2013 Elsevier Interactive Patient Education 2016 [employer]  Common Migraine Triggers   Foods Aged cheese, alcohol, nuts, chocolate, yogurt, onions, figs, liver, caffeinated foods and beverages, monosodium glutamate (MSG), smoked or pickled fish/meat, nitrate/nitrate preserved foods (hotdogs, pepperoni, salami) tyramine  Medications Antibiotics (tetracycline, griseofulvin), antihypertensives (nifedipine, captopril), hormones (oral contraceptives, estrogens), histamine-2 blockers (cimetidine, raniidine, vasodilators (nitroglycerine, isosorbide dinitrate)  Sensory Stimuli Flickering/bright/fluorescent lights, bright sunlight, odors (perfume, chemicals, cigarette smoke)  Lifestyle Changes Time zones, sleep patterns, eating habits, caffeine withdrawal stress  Other Menstrual cycle, weather/season/air pressure changes, high altitude  Adapted from [place] and [place], [place]. Clin. J. Med. 1995; [place]oport and [place]. Conquering Headache, 1998  Hormonal variations also are believed to play a part.  Fluctuations of the female hormone estrogen (such as just  before menstruation) affect a chemical called serotonin-when serotonin levels in the brain fall, the dilation (expansion) of blood vessels in the brain that is characteristic of migraine often follows.  Many factors or "triggers" can start a migraine.  In people who get migraines, most experts think certain activities or foods may trigger temporary changes in the blood vessels around the brain.  Swelling of these blood vessels may cause pain in the nearby nerves.  Allergy Headaches:  Hotdogs Milk  Onions  Thyme Bacon  Chocolate Garlic  Nutmeg Ham  Dark Cola Pork  Cinnamon Salami  Nuts  Egg  Ginger Sausage Red wine Cloves  Cheddar Cheese Caffeine  What is the TMJ? The temporomandibular (tem-PUH-ro-man-DIB-yoo-ler) joint, or the TMJ, connects the upper and lower jawbones. This joint allows the jaw to open wide and move back and forth when you chew, talk, or yawn.There are also several muscles that help this joint move. There can be muscle tightness and pain in the muscle that can cause several symptoms.  What causes TMJ pain? There are many causes of TMJ pain. Repeated chewing (for example, chewing gum) and clenching your teeth can cause pain in the joint. Some TMJ pain has no obvious cause. What can I do to ease the pain? There are many things you can do to help your pain get better. When you have pain:  Eat soft foods and stay away from chewy foods (for example, taffy) Try to use both sides of your mouth to chew Don't chew gum  Massage Don't open your mouth wide (for example, during yawning or singing) Don't bite your cheeks or fingernails Lower your amount of stress and worry Applying a warm, damp washcloth to the joint may help. Over-the-counter pain medicines such as ibuprofen (one brand: Advil) or acetaminophen (one brand: Tylenol) might also help. Do not use these medicines if you are allergic to them or if your doctor told you not to use them. How can I stop the pain from coming  back? When your pain is better, you can do these exercises to make your muscles stronger and to keep the pain from coming back:  Resisted mouth opening: Place your thumb or two fingers under your chin and open your mouth slowly, pushing up lightly on your chin with your thumb. Hold for three to six seconds. Close your mouth slowly. Resisted mouth closing: Place your thumbs under your chin and your two index fingers on the ridge between your mouth and the bottom of your chin. Push down lightly on your chin as you close your mouth. Tongue up: Slowly open and close your mouth while keeping the tongue touching the roof of the mouth. Side-to-side jaw movement: Place an object about one fourth of an inch thick (for example, two tongue depressors) between your front teeth. Slowly move your jaw from side to side. Increase the thickness of the object as the exercise becomes easier Forward jaw movement: Place an object about one fourth of an inch thick between your front teeth and move the bottom jaw forward so that the bottom teeth are in front of the top teeth. Increase the thickness of the object as the exercise becomes easier. These exercises should not be painful. If it hurts to do these exercises, stop doing them and talk to your family doctor.   Psychological Services:  [facility] [address] [phone]  [place], [place]. [address], [place], ACCESS LINE: [phone] or [phone], [email or web address] Mobile Crisis Teams:  [facility]  Mobile Crisis Care Unit  [phone]  Assertive  Psychotherapeutic Services  [address] [name]  [phone]  [facility]  [address] [place]  [address]  [place]  [phone]  Self-Help/Support Groups:  [facility] [employer] of support groups  [phone] (call for more info)

## 2017-04-13 NOTE — Progress Notes (Signed)
Subjective:    Patient ID: Makayla [name], female    DOB: August 08, 1981, 36 y.o.   MRN: [id number]  HPI 36 y.o. WF presents with headache.  Has had migraine last night, not sleeping due to this, had nausea and vomiting last night. She is on imipramine nightly, forgot to take it Monday. She was on flexeril for headaches as well. She continues to have headache, still with nausea, no vomiting. Her aunt drove her.   Blood pressure 100/78, pulse (!) 102, temperature (!) 97.3 F (36.3 C), resp. rate 16, height 5' 7.5" (1.715 m), weight 123 lb 6.4 oz (56 kg), SpO2 98 %.  Medications Current Outpatient Prescriptions on File Prior to Visit  Medication Sig  . ALPRAZolam (XANAX) 0.5 MG tablet Take 1 tablet (0.5 mg total) by mouth 3 (three) times daily as needed.  Marland Kitchen. FLUoxetine (PROZAC) 20 MG capsule Take 1 capsule (20 mg total) by mouth daily.  Marland Kitchen. imipramine (TOFRANIL) 50 MG tablet Take 3 tablets (150 mg total) by mouth at bedtime.   No current facility-administered medications on file prior to visit.     Problem list She has Allergy; Asthma; Hyperlipidemia; IBS (irritable bowel syndrome); Migraines; TMJ syndrome; Depression; Anxiety; OCD (obsessive compulsive disorder); and Medication management on her problem list.   Review of Systems  Constitutional: Negative.  Negative for chills and fever.  HENT: Negative for tinnitus.   Eyes: Negative for photophobia, pain, discharge, redness, itching and visual disturbance.  Respiratory: Negative.   Cardiovascular: Negative.   Gastrointestinal: Positive for nausea. Negative for abdominal distention, abdominal pain, anal bleeding, blood in stool, constipation, diarrhea, rectal pain and vomiting.  Genitourinary: Negative.   Musculoskeletal: Negative.  Negative for arthralgias, neck pain and neck stiffness.  Neurological: Positive for headaches. Negative for dizziness, tremors, seizures, syncope, facial asymmetry, speech difficulty, weakness,  light-headedness and numbness.  Psychiatric/Behavioral: Negative.        Objective:   Physical Exam  Constitutional: She is oriented to person, place, and time. She appears well-developed and well-nourished.  HENT:  Head: Normocephalic and atraumatic.  Right Ear: External ear normal.  Left Ear: External ear normal.  Mouth/Throat: Oropharynx is clear and moist.  Eyes: Conjunctivae and EOM are normal. Pupils are equal, round, and reactive to light.  Neck: Normal range of motion. Neck supple. No thyromegaly present.  Cardiovascular: Normal rate, regular rhythm and normal heart sounds.  Exam reveals no gallop and no friction rub.   No murmur heard. Pulmonary/Chest: Effort normal and breath sounds normal. No respiratory distress. She has no wheezes.  Abdominal: Soft. Bowel sounds are normal. She exhibits no distension and no mass. There is no tenderness. There is no rebound and no guarding.  Musculoskeletal: Normal range of motion.  Lymphadenopathy:    She has no cervical adenopathy.  Neurological: She is alert and oriented to person, place, and time. She displays normal reflexes. No cranial nerve deficit. Coordination normal.  Skin: Skin is warm and dry.  Psychiatric: She has a normal mood and affect.       Assessment & Plan:   Obsessive-compulsive disorder, unspecified type LONG DISCUSSION that if she does not get help we will not be able to help her, feel at this time she is beyond the scope of practice and would benefit from multifactorial approach, her anxiety/OCD affects her quality of life, patient will see psych.  No SI/HI, will go to Er if any SI/HI - citalopram (CELEXA) 20 MG tablet; Take 1 tablet (20 mg total) by  mouth daily.  Dispense: 30 tablet; Refill: 2   Episode of recurrent major depressive disorder, unspecified depression episode severity (HCC) - citalopram (CELEXA) 20 MG tablet; Take 1 tablet (20 mg total) by mouth daily.  Dispense: 30 tablet; Refill: 2  Other  migraine without status migrainosus, not intractable - ketorolac (TORADOL) injection 60 mg; Inject 2 mLs (60 mg total) into the muscle once. - promethazine (PHENERGAN) pills - cyclobenzaprine (FLEXERIL) 10 MG tablet; Take 1 tablet (10 mg total) by mouth 3 (three) times daily as needed for muscle spasms.  Dispense: 60 tablet; Refill: 0

## 2017-05-19 ENCOUNTER — Other Ambulatory Visit: Payer: Self-pay | Admitting: Physician Assistant

## 2017-05-20 NOTE — Telephone Encounter (Signed)
XANAX CALLED INTO PHARMACY ON AUG 17TH 2018 AT 8:46AM BY DD

## 2017-06-20 ENCOUNTER — Ambulatory Visit: Payer: Self-pay | Admitting: Physician Assistant

## 2017-06-25 ENCOUNTER — Other Ambulatory Visit: Payer: Self-pay | Admitting: Physician Assistant

## 2017-06-25 DIAGNOSIS — G43809 Other migraine, not intractable, without status migrainosus: Secondary | ICD-10-CM

## 2017-06-29 NOTE — Progress Notes (Deleted)
FOLLOW UP  Assessment and Plan:   No problem-specific Assessment & Plan notes found for this encounter.   Cholesterol  Check lipid panel.   Vitamin D Def/ Osteoporosis prevention  Start a supplement: 5000 IU daily  Check Vit D level  Continue diet and meds as discussed. Further disposition pending results of labs. Discussed med's effects and SE's.   Over 30 minutes of exam, counseling, chart review, and critical decision making was performed.   Future Appointments Date Time Provider Department Center  06/30/2017 10:30 AM Makayla Gaudier, NP [facility] None    ----------------------------------------------------------------------------------------------------------------------  HPI 36 y.o. female  presents for acute complaint of upper respiratory infection symptoms  Was going to group therapy***   . Infobehavior... Give to patient if not going    She also has a medication history listed of depression, anxiety and OCD, and was informed at her last visit that she needed to see a psychiatrist or would be dismissed from the practice after 30 days. She is currently being treated with citalopram 20 mg daily, imipramine 150 mg at HS, xanax 0.5 mg TID PRN.   She has hyperlipidemia listed in her active medical problems without record of a lipid panel available: No results found for: CHOL, HDL, LDLCALC, LDLDIRECT, TRIG, CHOLHDL   She has a documented low vitamin D in 2017, but has not*** been taking recommended supplement:  Lab Results  Component Value Date   VD25OH 21 (L) 02/24/2016    Current Medications:  Current Outpatient Prescriptions on File Prior to Visit  Medication Sig  . ALPRAZolam (XANAX) 0.5 MG tablet TAKE ONE TABLET BY MOUTH THREE TIMES A DAY AS NEEDED  . citalopram (CELEXA) 20 MG tablet Take 1 tablet (20 mg total) by mouth daily.  . cyclobenzaprine (FLEXERIL) 10 MG tablet TAKE 1 TABLET BY MOUTH THREE TIMES A DAY AS NEEDED FOR MUSCLE SPASMS  . imipramine  (TOFRANIL) 50 MG tablet Take 3 tablets (150 mg total) by mouth at bedtime.  . promethazine (PHENERGAN) 25 MG tablet Take 1 tablet (25 mg total) by mouth every 6 (six) hours as needed for nausea or vomiting. Max: 4 tablets per day   No current facility-administered medications on file prior to visit.      Allergies:  Allergies  Allergen Reactions  . Zoloft [Sertraline Hcl]      Medical History:  Past Medical History:  Diagnosis Date  . Allergy   . Anxiety   . Asthma   . Depression   . Hyperlipidemia   . IBS (irritable bowel syndrome)   . Migraines   . OCD (obsessive compulsive disorder)   . TMJ syndrome    Family history- Reviewed and unchanged Social history- Reviewed and unchanged   Review of Systems:  ROS    Physical Exam: There were no vitals taken for this visit. Wt Readings from Last 3 Encounters:  04/13/17 123 lb 6.4 oz (56 kg)  12/13/16 126 lb (57.2 kg)  08/04/16 124 lb 3.2 oz (56.3 kg)   General Appearance: Well nourished, in no apparent distress. Eyes: PERRLA, EOMs, conjunctiva no swelling or erythema Sinuses: No Frontal/maxillary tenderness ENT/Mouth: Ext aud canals clear, TMs without erythema, bulging. No erythema, swelling, or exudate on post pharynx.  Tonsils not swollen or erythematous. Hearing normal.  Neck: Supple, thyroid normal.  Respiratory: Respiratory effort normal, BS equal bilaterally without rales, rhonchi, wheezing or stridor.  Cardio: RRR with no MRGs. Brisk peripheral pulses without edema.  Abdomen: Soft, + BS.  Non tender, no  guarding, rebound, hernias, masses. Lymphatics: Non tender without lymphadenopathy.  Musculoskeletal: Full ROM, 5/5 strength, {PSY - GAIT AND STATION:22860} gait Skin: Warm, dry without rashes, lesions, ecchymosis.  Neuro: Cranial nerves intact. No cerebellar symptoms.  Psych: Awake and oriented X 3, normal affect, Insight and Judgment appropriate.    [name], NP 10:14 AM [name]

## 2017-06-30 ENCOUNTER — Other Ambulatory Visit: Payer: Self-pay | Admitting: Physician Assistant

## 2017-06-30 ENCOUNTER — Ambulatory Visit: Payer: Self-pay | Admitting: Adult Health

## 2017-07-01 ENCOUNTER — Other Ambulatory Visit: Payer: Self-pay

## 2017-07-01 MED ORDER — IMIPRAMINE HCL 50 MG PO TABS: 150.0000 mg | ORAL_TABLET | Freq: Every day | ORAL | 1 refills | Status: DC

## 2017-07-01 NOTE — Telephone Encounter (Signed)
Xanax called into pharmacy on 28th Sept 2018 BY DD 
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
## 2017-07-04 ENCOUNTER — Ambulatory Visit (INDEPENDENT_AMBULATORY_CARE_PROVIDER_SITE_OTHER): Payer: Self-pay | Admitting: Adult Health

## 2017-07-04 ENCOUNTER — Other Ambulatory Visit: Payer: Self-pay

## 2017-07-04 ENCOUNTER — Encounter: Payer: Self-pay | Admitting: Adult Health

## 2017-07-04 VITALS — BP 110/74 | Temp 97.7°F | Resp 14 | Ht 67.5 in | Wt 127.8 lb

## 2017-07-04 DIAGNOSIS — Z79899 Other long term (current) drug therapy: Secondary | ICD-10-CM

## 2017-07-04 DIAGNOSIS — F419 Anxiety disorder, unspecified: Secondary | ICD-10-CM

## 2017-07-04 DIAGNOSIS — F429 Obsessive-compulsive disorder, unspecified: Secondary | ICD-10-CM

## 2017-07-04 DIAGNOSIS — F339 Major depressive disorder, recurrent, unspecified: Secondary | ICD-10-CM

## 2017-07-04 DIAGNOSIS — R002 Palpitations: Secondary | ICD-10-CM

## 2017-07-04 MED ORDER — CITALOPRAM HYDROBROMIDE 40 MG PO TABS: 40.0000 mg | ORAL_TABLET | Freq: Every day | ORAL | 1 refills | Status: DC

## 2017-07-04 MED ORDER — IMIPRAMINE HCL 50 MG PO TABS: 150.0000 mg | ORAL_TABLET | Freq: Every day | ORAL | 1 refills | Status: DC

## 2017-07-04 MED ORDER — RANITIDINE HCL 150 MG PO TABS: 150.0000 mg | ORAL_TABLET | Freq: Two times a day (BID) | ORAL | 1 refills | Status: DC

## 2017-07-04 NOTE — Patient Instructions (Addendum)
We are increasing your dose of celexa, which has been prescribed for depression, anxiety and OCD. You can take 1 and 1/2 tabs of your current medication for a total of 30 mg for a week, then start the new prescription of 40 mg daily.   We would like for you to follow up with a counselor from one of the below resources regarding your anxiety and OCD:    Psychological Services:  [facility] [address] [phone]  [place], [place] [address] [place], ACCESS LINE: [phone] or [phone], [email or web address] Mobile Crisis Teams:  [facility]  Mobile Crisis Care Unit  [phone]  Assertive  Psychotherapeutic Services  [address] [name]  [phone]  Inter[facility]  [address] [place]  [address]  [place]  [phone]  Self-Help/Support Groups:  [facility] [employer] of support groups  [phone] (call for more info)   HOW TO TREAT VIRAL COUGH AND COLD SYMPTOMS:  -Symptoms usually last at least 1 week with the worst symptoms being around day 4.  - colds usually start with a sore throat and end with a cough, and the cough can take 2 weeks to get better.  -No antibiotics are needed for colds, flu, sore throats, cough, bronchitis UNLESS symptoms are longer than 7 days OR if you are getting better then get drastically worse.  -There are a lot of combination medications (Dayquil, Nyquil, Vicks 44, tyelnol cold and sinus, ETC). Please look at the ingredients on the back so that you are treating the correct symptoms and not doubling up on medications/ingredients.    Medicines you can use  Nasal congestion  - pseudoephedrine (Sudafed)- behind the counter, do not use if you have high blood pressure, medicine that have -D in them.  - phenylephrine (Sudafed PE) -Dextormethorphan + chlorpheniramine (Coridcidin HBP)- okay if you have high blood pressure -Oxymetazoline  (Afrin) nasal spray- LIMIT to 3 days -Saline nasal spray -Neti pot (used distilled or bottled water)  Ear pain/congestion  -pseudoephedrine (sudafed) - Nasonex/flonase nasal spray  Fever  -Acetaminophen (Tyelnol) -Ibuprofen (Advil, motrin, aleve)  Sore Throat  -Acetaminophen (Tyelnol) -Ibuprofen (Advil, motrin, aleve) -Drink a lot of water -Gargle with salt water - Rest your voice (don't talk) -Throat sprays -Cough drops  Body Aches  -Acetaminophen (Tyelnol) -Ibuprofen (Advil, motrin, aleve)  Headache  -Acetaminophen (Tyelnol) -Ibuprofen (Advil, motrin, aleve) - Exedrin, Exedrin Migraine  Allergy symptoms (cough, sneeze, runny nose, itchy eyes) -Claritin or loratadine cheapest but likely the weakest  -Zyrtec or certizine at night because it can make you sleepy -The strongest is allegra or fexafinadine  Cheapest at [employer], [employer], [employer]  Cough  -Dextromethorphan (Delsym)- medicine that has DM in it -Guafenesin (Mucinex/Robitussin) - cough drops - drink lots of water  Chest Congestion  -Guafenesin (Mucinex/Robitussin)  Red Itchy Eyes  - Naphcon-A  Upset Stomach  - Bland diet (nothing spicy, greasy, fried, and high acid foods like tomatoes, oranges, berries) -OKAY- cereal, bread, soup, crackers, rice -Eat smaller more frequent meals -reduce caffeine, no alcohol -Loperamide (Imodium-AD) if diarrhea -Prevacid for heart burn  General health when sick  -Hydration -wash your hands frequently -keep surfaces clean -change pillow cases and sheets often -Get fresh air but do not exercise strenuously -Vitamin D, double up on it - Vitamin C -Zinc

## 2017-07-05 LAB — CBC WITH DIFFERENTIAL/PLATELET
BASOS ABS: 41 {cells}/uL (ref 0–200)
BASOS PCT: 0.7 %
EOS ABS: 168 {cells}/uL (ref 15–500)
Eosinophils Relative: 2.9 %
HCT: 39.7 % (ref 35.0–45.0)
Hemoglobin: 13.3 g/dL (ref 11.7–15.5)
Lymphs Abs: 1270 cells/uL (ref 850–3900)
MCH: 30.3 pg (ref 27.0–33.0)
MCHC: 33.5 g/dL (ref 32.0–36.0)
MCV: 90.4 fL (ref 80.0–100.0)
MPV: 10.9 fL (ref 7.5–12.5)
Monocytes Relative: 7.1 %
NEUTROS PCT: 67.4 %
Neutro Abs: 3909 cells/uL (ref 1500–7800)
PLATELETS: 190 10*3/uL (ref 140–400)
RBC: 4.39 10*6/uL (ref 3.80–5.10)
RDW: 11.2 % (ref 11.0–15.0)
TOTAL LYMPHOCYTE: 21.9 %
WBC: 5.8 10*3/uL (ref 3.8–10.8)
WBCMIX: 412 {cells}/uL (ref 200–950)

## 2017-07-05 LAB — COMPREHENSIVE METABOLIC PANEL
AG RATIO: 1.8 (calc) (ref 1.0–2.5)
ALBUMIN MSPROF: 4.2 g/dL (ref 3.6–5.1)
ALT: 8 U/L (ref 6–29)
AST: 13 U/L (ref 10–30)
Alkaline phosphatase (APISO): 48 U/L (ref 33–115)
BILIRUBIN TOTAL: 0.4 mg/dL (ref 0.2–1.2)
BUN: 11 mg/dL (ref 7–25)
CALCIUM: 9.5 mg/dL (ref 8.6–10.2)
CHLORIDE: 105 mmol/L (ref 98–110)
CO2: 30 mmol/L (ref 20–32)
Creat: 0.81 mg/dL (ref 0.50–1.10)
Globulin: 2.4 g/dL (calc) (ref 1.9–3.7)
Glucose, Bld: 88 mg/dL (ref 65–99)
POTASSIUM: 5.3 mmol/L (ref 3.5–5.3)
SODIUM: 141 mmol/L (ref 135–146)
TOTAL PROTEIN: 6.6 g/dL (ref 6.1–8.1)

## 2017-07-05 LAB — SPECIMEN COMPROMISED

## 2017-08-05 ENCOUNTER — Other Ambulatory Visit: Payer: Self-pay | Admitting: Physician Assistant

## 2017-08-09 ENCOUNTER — Other Ambulatory Visit: Payer: Self-pay | Admitting: Physician Assistant

## 2017-09-09 ENCOUNTER — Other Ambulatory Visit: Payer: Self-pay | Admitting: Physician Assistant

## 2017-09-09 DIAGNOSIS — G43809 Other migraine, not intractable, without status migrainosus: Secondary | ICD-10-CM

## 2017-10-14 ENCOUNTER — Other Ambulatory Visit: Payer: Self-pay | Admitting: Physician Assistant

## 2017-10-14 DIAGNOSIS — G43809 Other migraine, not intractable, without status migrainosus: Secondary | ICD-10-CM

## 2017-10-27 NOTE — Progress Notes (Deleted)
Assessment and Plan:  There are no diagnoses linked to this encounter.    Further disposition pending results of labs. Discussed med's effects and SE's.   Over 30 minutes of exam, counseling, chart review, and critical decision making was performed.   Future Appointments  Date Time Provider Department Center  10/28/2017 11:30 AM Judd Gaudierorbett, Makayla Broski, NP [facility] None    ------------------------------------------------------------------------------------------------------------------   HPI 37 y.o.female presents for  Past Medical History:  Diagnosis Date  . Allergy   . Anxiety   . Asthma   . Depression   . Hyperlipidemia   . IBS (irritable bowel syndrome)   . Migraines   . OCD (obsessive compulsive disorder)   . TMJ syndrome      Allergies  Allergen Reactions  . Zoloft [Sertraline Hcl]     Current Outpatient Medications on File Prior to Visit  Medication Sig  . ALPRAZolam (XANAX) 0.5 MG tablet TAKE ONE TABLET BY MOUTH THREE TIMES A DAY AS NEEDED  . citalopram (CELEXA) 40 MG tablet Take 1 tablet (40 mg total) by mouth daily.  . cyclobenzaprine (FLEXERIL) 10 MG tablet TAKE ONE TABLET BY MOUTH THREE TIMES A DAY AS NEEDED FOR MUSCLE SPASMS  . imipramine (TOFRANIL) 50 MG tablet Take 3 tablets (150 mg total) by mouth at bedtime.  . ranitidine (ZANTAC) 150 MG tablet Take 1 tablet (150 mg total) by mouth 2 (two) times daily.   No current facility-administered medications on file prior to visit.     ROS: all negative except above.   Physical Exam:  There were no vitals taken for this visit.  General Appearance: Well nourished, in no apparent distress. Eyes: PERRLA, EOMs, conjunctiva no swelling or erythema Sinuses: No Frontal/maxillary tenderness ENT/Mouth: Ext aud canals clear, TMs without erythema, bulging. No erythema, swelling, or exudate on post pharynx.  Tonsils not swollen or erythematous. Hearing normal.  Neck: Supple, thyroid normal.  Respiratory: Respiratory  effort normal, BS equal bilaterally without rales, rhonchi, wheezing or stridor.  Cardio: RRR with no MRGs. Brisk peripheral pulses without edema.  Abdomen: Soft, + BS.  Non tender, no guarding, rebound, hernias, masses. Lymphatics: Non tender without lymphadenopathy.  Musculoskeletal: Full ROM, 5/5 strength, normal gait.  Skin: Warm, dry without rashes, lesions, ecchymosis.  Neuro: Cranial nerves intact. Normal muscle tone, no cerebellar symptoms. Sensation intact.  Psych: Awake and oriented X 3, normal affect, Insight and Judgment appropriate.     Makayla MakerAshley C Caroleen Stoermer, NP 7:12 PM [facility]

## 2017-10-28 ENCOUNTER — Ambulatory Visit: Payer: Self-pay | Admitting: Adult Health
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
## 2017-11-01 ENCOUNTER — Ambulatory Visit: Payer: Self-pay | Admitting: Adult Health

## 2017-11-10 NOTE — Progress Notes (Signed)
Assessment and Plan:  Makayla [name] was seen today for fatigue.  Diagnoses and all orders for this visit:  Fatigue, unspecified type      No other notable accompaniments, possibly related to stopping celexa abruptly and recent poor lifestyle after her mother passed away -  Discussed labs (CBC, BMP, TSH) - today, patient is self pay and would like to defer at this time On review she has low vitamin D -recommended she consider 4000 U supplement daily, B complex vitamin daily for energy, improve lifestyle  Episode of recurrent major depressive disorder, unspecified depression episode severity (HCC) -     citalopram (CELEXA) 40 MG tablet; Take 1 tablet (40 mg total) by mouth daily.       Restart celexa - may start with 1/2 tab (20 mg) and titrate up as needed       Follow up with grief support group as planned -        Lifestyle discussed: diet/exerise, sleep hygiene, stress management, hydration  Further disposition pending results of labs. Discussed med's effects and SE's.   Over 30 minutes of exam, counseling, chart review, and critical decision making was performed.   No future appointments.  ------------------------------------------------------------------------------------------------------------------   HPI BP 110/76   Pulse 81   Temp 98.1 F (36.7 C)   Ht 5' 7.5" (1.715 m)   Wt 134 lb (60.8 kg)   SpO2 99%   BMI 20.68 kg/m   37 y.o.female presents for c/o new fatigue/malaise superimposed on baseline ongoing fatigue for  3 weeks. She reports she had stopped taking celexa mid-January, and admits symptoms possibly coincide with this. She denies any other symptoms or complaints - fever/chills, URI or flu symptoms, nausea, vomiting, seasonal allergies, myalgias/arthralgias, HA, dizziness. She admits lifestyle has been erratic and has not been eating meals as regularly in her new home situation in attempt to avoid her stepmother.   She has looked into counseling after ongoing  discussion and recommendation  - has found a Weekly support group, "[facility]" Tuesdays at 6.30 pm that she plans to start attending on 2/12.   She has ongoing OCD (excessive handwashing)/anxiety -  takes 3 tabs xanax on days that she's at work, but on days that she is off typically takes 1-2 as needed.    Past Medical History:  Diagnosis Date  . Allergy   . Anxiety   . Asthma   . Depression   . Hyperlipidemia   . IBS (irritable bowel syndrome)   . Migraines   . OCD (obsessive compulsive disorder)   . TMJ syndrome      Allergies  Allergen Reactions  . Zoloft [Sertraline Hcl]     Current Outpatient Medications on File Prior to Visit  Medication Sig  . ALPRAZolam (XANAX) 0.5 MG tablet TAKE ONE TABLET BY MOUTH THREE TIMES A DAY AS NEEDED  . cyclobenzaprine (FLEXERIL) 10 MG tablet TAKE ONE TABLET BY MOUTH THREE TIMES A DAY AS NEEDED FOR MUSCLE SPASMS  . imipramine (TOFRANIL) 50 MG tablet Take 3 tablets (150 mg total) by mouth at bedtime.  . ranitidine (ZANTAC) 150 MG tablet Take 1 tablet (150 mg total) by mouth 2 (two) times daily. (Patient not taking: Reported on 11/11/2017)   No current facility-administered medications on file prior to visit.     ROS: Review of Systems  Constitutional: Positive for malaise/fatigue. Negative for weight loss.  HENT: Negative for hearing loss and tinnitus.   Eyes: Negative for blurred vision and double vision.  Respiratory: Negative for  cough, sputum production, shortness of breath and wheezing.   Cardiovascular: Negative for chest pain, palpitations, orthopnea, claudication and leg swelling.  Gastrointestinal: Negative for abdominal pain, blood in stool, constipation, diarrhea, heartburn, melena, nausea and vomiting.  Genitourinary: Negative.   Musculoskeletal: Negative for joint pain and myalgias.  Skin: Negative for rash.  Neurological: Negative for dizziness, tingling, sensory change, weakness and headaches.  Endo/Heme/Allergies: Negative  for environmental allergies and polydipsia.  Psychiatric/Behavioral: Positive for depression. Negative for hallucinations, memory loss, substance abuse and suicidal ideas. The patient is nervous/anxious. The patient does not have insomnia.   All other systems reviewed and are negative.    Physical Exam:  BP 110/76   Pulse 81   Temp 98.1 F (36.7 C)   Ht 5' 7.5" (1.715 m)   Wt 134 lb (60.8 kg)   SpO2 99%   BMI 20.68 kg/m   General Appearance: Well nourished, in no apparent distress. Eyes: PERRLA, EOMs, conjunctiva no swelling or erythema Sinuses: No Frontal/maxillary tenderness ENT/Mouth: Ext aud canals clear, TMs without erythema, bulging. No erythema, swelling, or exudate on post pharynx.  Tonsils not swollen or erythematous. Hearing normal.  Neck: Supple, thyroid normal.  Respiratory: Respiratory effort normal, BS equal bilaterally without rales, rhonchi, wheezing or stridor.  Cardio: RRR with no MRGs. Brisk peripheral pulses without edema.  Abdomen: Soft, + BS.  Non tender, no guarding, rebound, hernias, masses. Lymphatics: Non tender without lymphadenopathy.  Musculoskeletal: symmetrical strength, normal gait.  Skin: Warm, dry without rashes, lesions, ecchymosis. Bilateral hands reddened, extremely dry, flaky without fissures or signs of infection - Neuro: Cranial nerves intact. Normal muscle tone, no cerebellar symptoms. Sensation intact.  Psych: Awake and oriented X 3, flat/depressed affect, Insight and Judgment appropriate.     Dan MakerAshley C Lilyana Lippman, NP 1:12 PM [facility]

## 2017-11-11 ENCOUNTER — Encounter: Payer: Self-pay | Admitting: Adult Health

## 2017-11-11 ENCOUNTER — Ambulatory Visit (INDEPENDENT_AMBULATORY_CARE_PROVIDER_SITE_OTHER): Payer: Self-pay | Admitting: Adult Health

## 2017-11-11 VITALS — BP 110/76 | HR 81 | Temp 98.1°F | Ht 67.5 in | Wt 134.0 lb

## 2017-11-11 DIAGNOSIS — R5383 Other fatigue: Secondary | ICD-10-CM

## 2017-11-11 DIAGNOSIS — F419 Anxiety disorder, unspecified: Secondary | ICD-10-CM

## 2017-11-11 DIAGNOSIS — F339 Major depressive disorder, recurrent, unspecified: Secondary | ICD-10-CM

## 2017-11-11 MED ORDER — CITALOPRAM HYDROBROMIDE 40 MG PO TABS: 40.0000 mg | ORAL_TABLET | Freq: Every day | ORAL | 1 refills | Status: DC

## 2017-11-11 NOTE — Patient Instructions (Signed)
B- complex vitamin / or multivitamin with B vitamins added (look for 100% daily intake included)   Vitamin D 4000 Units daily  Restart celexa -     Aim for 7+ servings of fruits and vegetables daily  80+ fluid ounces of water or unsweet tea for healthy kidneys  Limit animal fats in diet for cholesterol and heart health - choose grass fed whenever available  Aim for low stress - take time to unwind and care for your mental health  Aim for 150 min of moderate intensity exercise weekly for heart health, and weights twice weekly for bone health  Aim for 7-9 hours of sleep daily

## 2017-11-18 ENCOUNTER — Other Ambulatory Visit: Payer: Self-pay | Admitting: Physician Assistant

## 2017-11-28 ENCOUNTER — Telehealth: Payer: Self-pay

## 2017-11-28 NOTE — Telephone Encounter (Signed)
Patient states that her face is red & swollen on one side. Only thing new taking is Vitamin D and has used a new lotion on the side of face that is red & swollen.

## 2017-11-29 NOTE — Telephone Encounter (Signed)
LMTCB

## 2017-11-30 NOTE — Telephone Encounter (Signed)
Left message on voicemail.

## 2017-12-22 ENCOUNTER — Other Ambulatory Visit: Payer: Self-pay | Admitting: Adult Health

## 2017-12-23 ENCOUNTER — Other Ambulatory Visit: Payer: Self-pay | Admitting: Adult Health

## 2017-12-23 ENCOUNTER — Other Ambulatory Visit: Payer: Self-pay | Admitting: Internal Medicine

## 2017-12-23 MED ORDER — ALPRAZOLAM 0.5 MG PO TABS: ORAL_TABLET | ORAL | 0 refills | Status: DC

## 2017-12-26 ENCOUNTER — Other Ambulatory Visit: Payer: Self-pay | Admitting: Adult Health

## 2017-12-27 ENCOUNTER — Other Ambulatory Visit: Payer: Self-pay

## 2017-12-27 ENCOUNTER — Other Ambulatory Visit: Payer: Self-pay | Admitting: Adult Health

## 2017-12-27 NOTE — Telephone Encounter (Signed)
Patient's prescription was sent to the wrong pharmacy. In que for your review with updated pharmacy, [facility] [address]  [facility] will be called to cancel.

## 2017-12-28 ENCOUNTER — Other Ambulatory Visit: Payer: Self-pay | Admitting: Internal Medicine

## 2017-12-28 MED ORDER — ALPRAZOLAM 0.5 MG PO TABS: ORAL_TABLET | ORAL | 0 refills | Status: DC

## 2018-01-05 ENCOUNTER — Other Ambulatory Visit: Payer: Self-pay | Admitting: Physician Assistant

## 2018-01-05 DIAGNOSIS — G43809 Other migraine, not intractable, without status migrainosus: Secondary | ICD-10-CM

## 2018-02-01 ENCOUNTER — Telehealth: Payer: Self-pay

## 2018-02-01 ENCOUNTER — Other Ambulatory Visit: Payer: Self-pay | Admitting: Adult Health

## 2018-02-01 MED ORDER — ALPRAZOLAM 0.5 MG PO TABS: ORAL_TABLET | ORAL | 0 refills | Status: DC

## 2018-02-01 NOTE — Telephone Encounter (Signed)
Refill request for Xanax. Patient states that she is almost out of meds. Made an appointment for 02/08/18. Can only come next week, she is self pay and she gets paid then and then will pay for the office visit.

## 2018-02-02 ENCOUNTER — Other Ambulatory Visit: Payer: Self-pay | Admitting: Physician Assistant

## 2018-02-02 NOTE — Progress Notes (Signed)
Future Appointments  Date Time Provider Department Center  02/08/2018 11:30 AM [name], NP [facility] None

## 2018-02-08 ENCOUNTER — Encounter: Payer: Self-pay | Admitting: Adult Health

## 2018-02-08 ENCOUNTER — Other Ambulatory Visit: Payer: Self-pay | Admitting: Internal Medicine

## 2018-02-08 ENCOUNTER — Ambulatory Visit: Payer: Self-pay | Admitting: Adult Health

## 2018-02-08 ENCOUNTER — Ambulatory Visit (INDEPENDENT_AMBULATORY_CARE_PROVIDER_SITE_OTHER): Payer: Self-pay | Admitting: Adult Health

## 2018-02-08 VITALS — BP 118/76 | HR 94 | Temp 97.7°F | Ht 67.5 in | Wt 136.0 lb

## 2018-02-08 DIAGNOSIS — G43809 Other migraine, not intractable, without status migrainosus: Secondary | ICD-10-CM

## 2018-02-08 DIAGNOSIS — F429 Obsessive-compulsive disorder, unspecified: Secondary | ICD-10-CM

## 2018-02-08 DIAGNOSIS — Z79899 Other long term (current) drug therapy: Secondary | ICD-10-CM

## 2018-02-08 DIAGNOSIS — F419 Anxiety disorder, unspecified: Secondary | ICD-10-CM

## 2018-02-08 DIAGNOSIS — F339 Major depressive disorder, recurrent, unspecified: Secondary | ICD-10-CM

## 2018-02-08 MED ORDER — CYCLOBENZAPRINE HCL 10 MG PO TABS: ORAL_TABLET | ORAL | 0 refills | Status: DC

## 2018-02-08 MED ORDER — IMIPRAMINE HCL 50 MG PO TABS: 150.0000 mg | ORAL_TABLET | Freq: Every day | ORAL | 1 refills | Status: DC

## 2018-02-08 NOTE — Progress Notes (Signed)
Assessment and Plan:  Makayla [name] was seen today for medication refill.  Diagnoses and all orders for this visit:  Patient is self-pay  Depression/anxiety Continue medications; continue limiting xanax use as much as possible Lifestyle discussed: diet/exerise, sleep hygiene, stress management, hydration Anticipate getting out of stressful home situation is critical to improving her symptoms -     imipramine (TOFRANIL) 50 MG tablet; Take 3 tablets (150 mg total) by mouth at bedtime.  Obsessive-compulsive disorder, unspecified type Continue with celexa; she understands she needs to find a specialist to manage and will continue to look for low-cost provider that is willing to take her   Further disposition pending results of labs. Discussed med's effects and SE's.   Over 30 minutes of exam, counseling, chart review, and critical decision making was performed.   Future Appointments  Date Time Provider Department Center  06/21/2018  2:30 PM [name], NP [facility] None    ------------------------------------------------------------------------------------------------------------------   HPI BP 118/76   Pulse 94   Temp 97.7 F (36.5 C)   Ht 5' 7.5" (1.715 m)   Wt 136 lb (61.7 kg)   LMP 02/08/2018   SpO2 99%   BMI 20.99 kg/m   37 y.o.female with depression, anxiety, OCD (compulsive handwashing under stress) and migraines presents for follow up. She has been working on lifestyle as discussed at last visit, but admits she still struggles to eat regularly due to stress and home environment, and is still working on hydration. She does admit to headaches 1-2 times weekly, but typically improves with OTC analgesics, flexeril. She continues to work on home/housing situation and is applying for housing assistance and may possibly move in with a coworker as a roommate.   she has a diagnosis of anxiety/OCD and is currently on celexa 40 mg, reports symptoms are fairly controlled on current  regimen. she reports she typically takes 1 mg xanax prior to her work shift and 0.5 mg later during her shift on days that she works, and tries to avoid taking on her days off unless has very stressful encounter with her step mom or with leaving home.   She has continued with grief group counseling, reports this is somewhat helpful but can't always make it.   Discussed ongoing need for her to speak with psych professional; she endorses she has called through list of resources provided by this office but hasn't had luck finding a provider that can take her at this time. She reports she has previously successfully found a provider by cold calling through the phone book and plans to restart this.   She endorses allergy symptoms for the past month or so; has been taking claritin and dayquil.   Past Medical History:  Diagnosis Date  . Allergy   . Anxiety   . Asthma   . Depression   . Hyperlipidemia   . IBS (irritable bowel syndrome)   . Migraines   . OCD (obsessive compulsive disorder)   . TMJ syndrome      Allergies  Allergen Reactions  . Zoloft [Sertraline Hcl]     Current Outpatient Medications on File Prior to Visit  Medication Sig  . ALPRAZolam (XANAX) 0.5 MG tablet Take 1/2 to 1 tablet 2 to 3 x / day  ONLY if have Acute Anxiety Attack & please try to limit to 5 days /week to avoid addiction  . citalopram (CELEXA) 40 MG tablet Take 1 tablet (40 mg total) by mouth daily.   No current facility-administered medications on file  prior to visit.     ROS: all negative except above.   Physical Exam:  BP 118/76   Pulse 94   Temp 97.7 F (36.5 C)   Ht 5' 7.5" (1.715 m)   Wt 136 lb (61.7 kg)   LMP 02/08/2018   SpO2 99%   BMI 20.99 kg/m   General Appearance: Well nourished, in no apparent distress. Eyes: PERRLA, EOMs, conjunctiva no swelling or erythema Sinuses: No Frontal/maxillary tenderness ENT/Mouth: Ext aud canals clear, TMs without erythema, bulging. No erythema,  swelling, or exudate on post pharynx.  Tonsils not swollen or erythematous. Hearing normal.  Neck: Supple, thyroid normal.  Respiratory: Respiratory effort normal, BS equal bilaterally without rales, rhonchi, wheezing or stridor.  Cardio: RRR with no MRGs. Brisk peripheral pulses without edema.  Abdomen: Soft, + BS.  Non tender, no guarding, rebound, hernias, masses. Lymphatics: Non tender without lymphadenopathy.  Musculoskeletal: Symmetrical strength, normal gait.  Skin: Warm, dry; bilateral hands very dry/chapped/flaky  Neuro: Cranial nerves intact. Normal muscle tone, no cerebellar symptoms. Sensation intact.  Psych: Awake and oriented X 3, normal affect, Insight and Judgment appropriate.     [name], NP 5:31 PM [facility]

## 2018-02-09 ENCOUNTER — Other Ambulatory Visit: Payer: Self-pay

## 2018-02-09 DIAGNOSIS — F419 Anxiety disorder, unspecified: Secondary | ICD-10-CM

## 2018-02-09 MED ORDER — ALPRAZOLAM 0.5 MG PO TABS: ORAL_TABLET | ORAL | 0 refills | Status: DC

## 2018-02-09 NOTE — Telephone Encounter (Signed)
Alprazolam prescription was sent in to the wrong pharmacy, was sent to [facility] and needs to go to [facility].

## 2018-04-25 ENCOUNTER — Other Ambulatory Visit: Payer: Self-pay | Admitting: Adult Health

## 2018-04-25 ENCOUNTER — Other Ambulatory Visit: Payer: Self-pay | Admitting: Internal Medicine

## 2018-04-25 DIAGNOSIS — F419 Anxiety disorder, unspecified: Secondary | ICD-10-CM

## 2018-04-25 MED ORDER — ALPRAZOLAM 0.5 MG PO TABS: ORAL_TABLET | ORAL | 0 refills | Status: DC

## 2018-04-27 ENCOUNTER — Other Ambulatory Visit: Payer: Self-pay | Admitting: Adult Health

## 2018-04-27 DIAGNOSIS — F419 Anxiety disorder, unspecified: Secondary | ICD-10-CM

## 2018-04-28 ENCOUNTER — Other Ambulatory Visit: Payer: Self-pay | Admitting: *Deleted

## 2018-04-28 DIAGNOSIS — F419 Anxiety disorder, unspecified: Secondary | ICD-10-CM

## 2018-04-28 MED ORDER — ALPRAZOLAM 0.5 MG PO TABS
ORAL_TABLET | ORAL | 0 refills | Status: DC
Start: 2018-04-28 — End: 2018-06-06

## 2018-06-06 ENCOUNTER — Other Ambulatory Visit: Payer: Self-pay | Admitting: Internal Medicine

## 2018-06-06 DIAGNOSIS — F419 Anxiety disorder, unspecified: Secondary | ICD-10-CM

## 2018-06-06 MED ORDER — ALPRAZOLAM 0.5 MG PO TABS: ORAL_TABLET | ORAL | 0 refills | Status: DC

## 2018-06-21 ENCOUNTER — Ambulatory Visit: Payer: Self-pay | Admitting: Adult Health

## 2018-07-13 ENCOUNTER — Telehealth: Payer: Self-pay

## 2018-07-13 NOTE — Telephone Encounter (Signed)
Refill request for Xanax. PMP checked, last filled on 06/06/18. Last office visit on 02/08/18, No office visit scheduled, Last prescription was to last > than Oct 15th. Please advise.

## 2018-07-17 ENCOUNTER — Other Ambulatory Visit: Payer: Self-pay | Admitting: Physician Assistant

## 2018-07-17 DIAGNOSIS — F419 Anxiety disorder, unspecified: Secondary | ICD-10-CM

## 2018-07-17 MED ORDER — ALPRAZOLAM 0.5 MG PO TABS: ORAL_TABLET | ORAL | 0 refills | Status: DC

## 2018-07-17 NOTE — Progress Notes (Signed)
Future Appointments  Date Time Provider Department Center  07/20/2018  2:00 PM [name], NP [facility] None

## 2018-07-19 NOTE — Progress Notes (Signed)
Assessment and Plan:  Makayla [name] was seen today for medication refill.  Diagnoses and all orders for this visit:  Patient is self-pay  Depression/anxiety Continue medications; continue limiting xanax use as much as possible, and doing well with this Lifestyle discussed: diet/exerise, sleep hygiene, stress management, hydration Anticipate getting out of stressful home situation is critical to improving her symptoms -     imipramine (TOFRANIL) 50 MG tablet; Take 3 tablets (150 mg total) by mouth at bedtime.  Obsessive-compulsive disorder, unspecified type Continue with celexa; she reports significantly improved depressive symptoms/mood since starting Continue to work towards finding a specialist for OCD management   Further disposition pending results of labs. Discussed med's effects and SE's.   Over 30 minutes of exam, counseling, chart review, and critical decision making was performed.   Future Appointments  Date Time Provider Department Center  01/23/2019  2:30 PM [name], NP [facility] None    ------------------------------------------------------------------------------------------------------------------   HPI BP 118/78   Pulse 80   Temp (!) 97.5 F (36.4 C)   Ht 5' 7.5" (1.715 m)   Wt 129 lb (58.5 kg)   SpO2 99%   BMI 19.91 kg/m   37 y.o.female with depression, anxiety, OCD (compulsive handwashing under stress) and migraines presents for follow up. She reports she has been in contact with Makayla [name] with mental health resources to be referred to mental health counseling resources. She is also considering [name]. She was previously seeing Makayla [name] but has disagreements and was too costly.   She has been working on lifestyle as discussed at last visit, but admits she still struggles to eat regularly due to stress and home environment, and is still working on hydration. Still living with dad and step-mom who served her an eviction notice in August but father stepped in.  She does admit to headaches 1-2 times weekly, but typically improves with OTC analgesics, flexeril. She continues to work on home/housing situation and is applying for housing assistance and may possibly move in with a coworker as a roommate.   she has a diagnosis of anxiety/OCD and is currently on celexa 40 mg, tofranil 150 mg nightly and reports symptoms are fairly controlled on current regimen. she reports she typically takes 1 mg xanax prior to her work shift and 0.5 mg later during her shift on days that she works, and tries to avoid taking on her days off unless has very stressful encounter with her step mom or with leaving home.   BMI is Body mass index is 19.91 kg/m. She admits last week was particularly stressful with her mother and couldn't stomach much but is feeling better Wt Readings from Last 3 Encounters:  07/20/18 129 lb (58.5 kg)  02/08/18 136 lb (61.7 kg)  11/11/17 134 lb (60.8 kg)    Past Medical History:  Diagnosis Date  . Allergy   . Anxiety   . Asthma   . Depression   . Hyperlipidemia   . IBS (irritable bowel syndrome)   . Migraines   . OCD (obsessive compulsive disorder)   . TMJ syndrome      Allergies  Allergen Reactions  . Zoloft [Sertraline Hcl]     Current Outpatient Medications on File Prior to Visit  Medication Sig  . ALPRAZolam (XANAX) 0.5 MG tablet Take 1/2-1 tablet 2-3 x /day ONLY if needed for Anxiety Attack & limit to 5 day/wk to avoid addiction.  . citalopram (CELEXA) 40 MG tablet Take 1 tablet (40 mg total) by mouth daily.  Marland Kitchen  cyclobenzaprine (FLEXERIL) 10 MG tablet TAKE ONE TABLET BY MOUTH THREE TIMES A DAY AS NEEDED FOR MUSCLE SPASMS   No current facility-administered medications on file prior to visit.     ROS: all negative except above.   Physical Exam:  BP 118/78   Pulse 80   Temp (!) 97.5 F (36.4 C)   Ht 5' 7.5" (1.715 m)   Wt 129 lb (58.5 kg)   SpO2 99%   BMI 19.91 kg/m   General Appearance: Well nourished, in no apparent  distress. Eyes: PERRLA, EOMs, conjunctiva no swelling or erythema Sinuses: No Frontal/maxillary tenderness ENT/Mouth: Ext aud canals clear, TMs without erythema, bulging. No erythema, swelling, or exudate on post pharynx.  Tonsils not swollen or erythematous. Hearing normal.  Neck: Supple, thyroid normal.  Respiratory: Respiratory effort normal, BS equal bilaterally without rales, rhonchi, wheezing or stridor.  Cardio: RRR with no MRGs. Brisk peripheral pulses without edema.  Abdomen: Soft, + BS.  Non tender, no guarding, rebound, hernias, masses. Lymphatics: Non tender without lymphadenopathy.  Musculoskeletal: Symmetrical strength, normal gait.  Skin: Warm, dry; bilateral hands very dry/chapped/flaky  Neuro: Cranial nerves intact. Normal muscle tone, no cerebellar symptoms. Sensation intact.  Psych: Awake and oriented X 3, normal affect, Insight and Judgment appropriate.     [name], NP 3:04 PM [facility]

## 2018-07-20 ENCOUNTER — Encounter: Payer: Self-pay | Admitting: Adult Health

## 2018-07-20 ENCOUNTER — Ambulatory Visit (INDEPENDENT_AMBULATORY_CARE_PROVIDER_SITE_OTHER): Payer: Self-pay | Admitting: Adult Health

## 2018-07-20 VITALS — BP 118/78 | HR 80 | Temp 97.5°F | Ht 67.5 in | Wt 129.0 lb

## 2018-07-20 DIAGNOSIS — Z79899 Other long term (current) drug therapy: Secondary | ICD-10-CM

## 2018-07-20 DIAGNOSIS — F339 Major depressive disorder, recurrent, unspecified: Secondary | ICD-10-CM

## 2018-07-20 DIAGNOSIS — Z6820 Body mass index (BMI) 20.0-20.9, adult: Secondary | ICD-10-CM

## 2018-07-20 DIAGNOSIS — F429 Obsessive-compulsive disorder, unspecified: Secondary | ICD-10-CM

## 2018-07-20 DIAGNOSIS — F419 Anxiety disorder, unspecified: Secondary | ICD-10-CM

## 2018-07-20 MED ORDER — IMIPRAMINE HCL 50 MG PO TABS: 150.0000 mg | ORAL_TABLET | Freq: Every day | ORAL | 1 refills | Status: DC

## 2018-07-20 NOTE — Patient Instructions (Signed)
Know what a healthy weight is for you (roughly BMI <25) and aim to maintain this  Aim for 7+ servings of fruits and vegetables daily  65-80+ fluid ounces of water or unsweet tea for healthy kidneys  Limit to max 1 drink of alcohol per day; avoid smoking/tobacco  Limit animal fats in diet for cholesterol and heart health - choose grass fed whenever available  Avoid highly processed foods, and foods high in saturated/trans fats  Aim for low stress - take time to unwind and care for your mental health  Aim for 150 min of moderate intensity exercise weekly for heart health, and weights twice weekly for bone health  Aim for 7-9 hours of sleep daily    

## 2018-08-15 ENCOUNTER — Other Ambulatory Visit: Payer: Self-pay | Admitting: Physician Assistant

## 2018-08-15 DIAGNOSIS — F419 Anxiety disorder, unspecified: Secondary | ICD-10-CM

## 2018-09-18 ENCOUNTER — Other Ambulatory Visit: Payer: Self-pay | Admitting: Adult Health

## 2018-09-18 DIAGNOSIS — F339 Major depressive disorder, recurrent, unspecified: Secondary | ICD-10-CM

## 2018-09-18 DIAGNOSIS — F419 Anxiety disorder, unspecified: Secondary | ICD-10-CM

## 2018-10-18 ENCOUNTER — Other Ambulatory Visit: Payer: Self-pay | Admitting: Adult Health

## 2018-10-18 DIAGNOSIS — F419 Anxiety disorder, unspecified: Secondary | ICD-10-CM

## 2018-11-20 ENCOUNTER — Other Ambulatory Visit: Payer: Self-pay | Admitting: Physician Assistant

## 2018-11-20 DIAGNOSIS — F419 Anxiety disorder, unspecified: Secondary | ICD-10-CM

## 2018-11-30 ENCOUNTER — Encounter: Payer: Self-pay | Admitting: Physician Assistant

## 2018-11-30 ENCOUNTER — Ambulatory Visit (INDEPENDENT_AMBULATORY_CARE_PROVIDER_SITE_OTHER): Payer: Self-pay | Admitting: Physician Assistant

## 2018-11-30 VITALS — BP 118/76 | HR 76 | Temp 97.3°F | Ht 67.5 in | Wt 133.0 lb

## 2018-11-30 DIAGNOSIS — F429 Obsessive-compulsive disorder, unspecified: Secondary | ICD-10-CM

## 2018-11-30 DIAGNOSIS — G43809 Other migraine, not intractable, without status migrainosus: Secondary | ICD-10-CM

## 2018-11-30 MED ORDER — PROMETHAZINE HCL 25 MG PO TABS: 25.0000 mg | ORAL_TABLET | Freq: Four times a day (QID) | ORAL | 0 refills | Status: DC | PRN

## 2018-11-30 MED ORDER — KETOROLAC TROMETHAMINE 60 MG/2ML IM SOLN
60.0000 mg | Freq: Once | INTRAMUSCULAR | Status: AC
  Administered 2018-11-30: 60 mg via INTRAMUSCULAR

## 2018-11-30 MED ORDER — METOCLOPRAMIDE HCL 5 MG PO TABS: 5.0000 mg | ORAL_TABLET | Freq: Three times a day (TID) | ORAL | 0 refills | Status: DC | PRN

## 2018-11-30 NOTE — Patient Instructions (Signed)
Can try reglan or metoclopramide for you next headache/migraine- this can help the migraine and the nausea, can take with iburpofen.   If this does not help you can take the promethazine or nausea medication.    1.  Limit use of pain relievers to no more than 2 days out of week to prevent risk of rebound or medication-overuse headache. 2.  Keep headache diary 3.  Exercise, hydration, caffeine cessation, sleep hygiene, monitor for and avoid triggers 4.  Consider:  magnesium citrate 400mg  daily, riboflavin 400mg  daily, and coenzyme Q10 100mg  three times daily   We may also treat TMJ if we think you have it If you are having frequent migraines we may put you on a once a day medication with fast acting medication to take. Also there is such a thing called rebound headache from over use of acute medications.  Please do not use rescue or acute medications more than 10 days a month or more than 3 days per week, this can cause a withdrawal and a rebound headache.  Here is more information below  Please remember, common headache triggers are: sleep deprivation, dehydration, overheating, stress, hypoglycemia or skipping meals and blood sugar fluctuations, excessive pain medications or excessive alcohol use or caffeine withdrawal. Some people have food triggers such as aged cheese, orange juice or chocolate, especially dark chocolate, or MSG (monosodium glutamate). Try to avoid these headache triggers as much possible.   It may be helpful to keep a headache diary to figure out what makes your headaches worse or brings them on and what alleviates them. Some people report headache onset after exercise but studies have shown that regular exercise may actually prevent headaches from coming. If you have exercise-induced headaches, please make sure that you drink plenty of fluid before and after exercising and that you do not over do it and do not overheat.   Please go to the ER if there is weakness,  thunderclap headache, visual changes, or any concerning factors    Migraine Headache A migraine headache is an intense, throbbing pain on one or both sides of your head. Recurrent migraines keep coming back. A migraine can last for 30 minutes to several hours. CAUSES  The exact cause of a migraine headache is not always known. However, a migraine may be caused when nerves in the brain become irritated and release chemicals that cause inflammation. This causes pain. Certain things may also trigger migraines, such as:   Alcohol.  Smoking.  Stress.  Menstruation.  Aged cheeses.  Foods or drinks that contain nitrates, glutamate, aspartame, or tyramine.  Lack of sleep.  Chocolate.  Caffeine.  Hunger.  Physical exertion.  Fatigue.  Medicines used to treat chest pain (nitroglycerine), birth control pills, estrogen, and some blood pressure medicines. SYMPTOMS   Pain on one or both sides of your head.  Pulsating or throbbing pain.  Severe pain that prevents daily activities.  Pain that is aggravated by any physical activity.  Nausea, vomiting, or both.  Dizziness.  Pain with exposure to bright lights, loud noises, or activity.  General sensitivity to bright lights, loud noises, or smells. Before you get a migraine, you may get warning signs that a migraine is coming (aura). An aura may include:  Seeing flashing lights.  Seeing bright spots, halos, or zigzag lines.  Having tunnel vision or blurred vision.  Having feelings of numbness or tingling.  Having trouble talking.  Having muscle weakness. DIAGNOSIS  A recurrent migraine headache is often diagnosed  based on:  Symptoms.  Physical examination.  A CT scan or MRI of your head. These imaging tests cannot diagnose migraines but can help rule out other causes of headaches.   TREATMENT  Medicines may be given for pain and nausea. Medicines can also be given to help prevent recurrent migraines. HOME CARE  INSTRUCTIONS  Only take over-the-counter or prescription medicines for pain or discomfort as directed by your health care provider. The use of long-term narcotics is not recommended.  Lie down in a dark, quiet room when you have a migraine.  Keep a journal to find out what may trigger your migraine headaches. For example, write down:  What you eat and drink.  How much sleep you get.  Any change to your diet or medicines.  Limit alcohol consumption.  Quit smoking if you smoke.  Get 7-9 hours of sleep, or as recommended by your health care provider.  Limit stress.  Keep lights dim if bright lights bother you and make your migraines worse. SEEK MEDICAL CARE IF:   You do not get relief from the medicines given to you.  You have a recurrence of pain.  You have a fever. SEEK IMMEDIATE MEDICAL CARE IF:  Your migraine becomes severe.  You have a stiff neck.  You have loss of vision.  You have muscular weakness or loss of muscle control.  You start losing your balance or have trouble walking.  You feel faint or pass out. . You have severe symptoms that are different from your first symptoms. MAKE SURE YOU:   Understand these instructions.  Will watch your condition.  Will get help right away if you are not doing well or get worse.   This information is not intended to replace advice given to you by your health care provider. Make sure you discuss any questions you have with your health care provider.   Document Released: 06/15/2001 Document Revised: 10/11/2014 Document Reviewed: 05/28/2013 Elsevier Interactive Patient Education 2016 [employer].  Common Migraine Triggers   Foods Aged cheese, alcohol, nuts, chocolate, yogurt, onions, figs, liver, caffeinated foods and beverages, monosodium glutamate (MSG), smoked or pickled fish/meat, nitrate/nitrate preserved foods (hotdogs, pepperoni, salami) tyramine  Medications Antibiotics (tetracycline, griseofulvin),  antihypertensives (nifedipine, captopril), hormones (oral contraceptives, estrogens), histamine-2 blockers (cimetidine, raniidine, vasodilators (nitroglycerine, isosorbide dinitrate)  Sensory Stimuli Flickering/bright/fluorescent lights, bright sunlight, odors (perfume, chemicals, cigarette smoke)  Lifestyle Changes Time zones, sleep patterns, eating habits, caffeine withdrawal stress  Other Menstrual cycle, weather/season/air pressure changes, high altitude  Adapted from [place], [place]. Clin. J. Med. 1995; [place] and [place]. Conquering Headache, 1998  Hormonal variations also are believed to play a part.  Fluctuations of the female hormone estrogen (such as just before menstruation) affect a chemical called serotonin-when serotonin levels in the brain fall, the dilation (expansion) of blood vessels in the brain that is characteristic of migraine often follows.  Many factors or "triggers" can start a migraine.  In people who get migraines, most experts think certain activities or foods may trigger temporary changes in the blood vessels around the brain.  Swelling of these blood vessels may cause pain in the nearby nerves.  Allergy Headaches:  Hotdogs Milk  Onions  Thyme Bacon  Chocolate Garlic  Nutmeg Ham  Dark Cola Pork  Cinnamon Salami  Nuts  Egg  Ginger Sausage Red wine Cloves  Cheddar Cheese Caffeine

## 2018-11-30 NOTE — Progress Notes (Signed)
Assessment and Plan:  Makayla [name] was seen today for acute visit and migraine.  Diagnoses and all orders for this visit:  Obsessive-compulsive disorder, unspecified type Continue medications Waiting to hear back from [facility] Continue celexa  Other migraine without status migrainosus, not intractable -     ketorolac (TORADOL) injection 60 mg -     metoCLOPramide (REGLAN) 5 MG tablet; Take 1 tablet (5 mg total) by mouth every 8 (eight) hours as needed for nausea (migraines). No red flag symptoms, normal neuro If continue to have worsening headaches will adjust preventative -     promethazine (PHENERGAN) 25 MG tablet; Take 1 tablet (25 mg total) by mouth every 6 (six) hours as needed (for migraines, can cause fatigue). Max: 4 tablets per day   Future Appointments  Date Time Provider Department Center  01/23/2019  2:30 PM [name], NP [facility] None     HPI 38 y.o.female presents for migraine headache.  She has a history of OCD/anxiety, migraines, she is on celexa 40 and imipramine 150mg  at night.  She reports last Friday has bad headache, vomited and normally she will vomit and feel better but she did not. Then Tuesday she woke up from a nap with a severe HA again, did not vomit that time, has had discomfort since that time.  She denies any associated neurological complications or symptoms, such as one-sided weakness, numbness, tingling, slurring of speech, droopy face, swallowing difficulties, diplopia, vision loss, hearing loss or tinnitus.    Past Medical History:  Diagnosis Date  . Allergy   . Anxiety   . Asthma   . Depression   . Hyperlipidemia   . IBS (irritable bowel syndrome)   . Migraines   . OCD (obsessive compulsive disorder)   . TMJ syndrome      Allergies  Allergen Reactions  . Zoloft [Sertraline Hcl]     Current Outpatient Medications on File Prior to Visit  Medication Sig  . ALPRAZolam (XANAX) 0.5 MG tablet TAKE ONE-HALF TO ONE TABLET BY MOUTH  TWO TO THREE TIMES A DAY ONLY IF NEEDED FOR ANXIETY ATTACK. LIMIT TO 5 DAYS A WEEK TO AVOID ADDICTION  . citalopram (CELEXA) 40 MG tablet TAKE 1 TABLET A DAY  . cyclobenzaprine (FLEXERIL) 10 MG tablet TAKE ONE TABLET BY MOUTH THREE TIMES A DAY AS NEEDED FOR MUSCLE SPASMS  . imipramine (TOFRANIL) 50 MG tablet Take 3 tablets (150 mg total) by mouth at bedtime.   No current facility-administered medications on file prior to visit.     ROS: all negative except above.   Physical Exam: Filed Weights   11/30/18 1606  Weight: 133 lb (60.3 kg)   BP 118/76   Pulse 76   Temp (!) 97.3 F (36.3 C)   Ht 5' 7.5" (1.715 m)   Wt 133 lb (60.3 kg)   LMP 11/25/2018   SpO2 99%   BMI 20.52 kg/m  General Appearance: Well nourished, in no apparent distress. Eyes: PERRLA, EOMs, conjunctiva no swelling or erythema Sinuses: No Frontal/maxillary tenderness ENT/Mouth: Ext aud canals clear, TMs without erythema, bulging. No erythema, swelling, or exudate on post pharynx.  Tonsils not swollen or erythematous. Hearing normal.  Neck: Supple, thyroid normal.  Respiratory: Respiratory effort normal, BS equal bilaterally without rales, rhonchi, wheezing or stridor.  Cardio: RRR with no MRGs. Brisk peripheral pulses without edema.  Abdomen: Soft, + BS.  Non tender, no guarding, rebound, hernias, masses. Lymphatics: Non tender without lymphadenopathy.  Musculoskeletal: Full ROM, 5/5 strength, normal  gait.  Skin: Warm, dry without rashes, lesions, ecchymosis.  Neuro: Cranial nerves intact. Normal muscle tone, no cerebellar symptoms. Sensation intact.  Psych: Awake and oriented X 3, normal affect, Insight and Judgment appropriate.     [name], PA-C 4:13 PM [facility]

## 2018-12-14 ENCOUNTER — Other Ambulatory Visit: Payer: Self-pay | Admitting: Adult Health

## 2018-12-14 ENCOUNTER — Other Ambulatory Visit: Payer: Self-pay | Admitting: Internal Medicine

## 2018-12-14 ENCOUNTER — Ambulatory Visit: Payer: Self-pay | Admitting: Adult Health

## 2018-12-14 DIAGNOSIS — F339 Major depressive disorder, recurrent, unspecified: Secondary | ICD-10-CM

## 2018-12-14 DIAGNOSIS — F419 Anxiety disorder, unspecified: Secondary | ICD-10-CM

## 2018-12-14 DIAGNOSIS — G43809 Other migraine, not intractable, without status migrainosus: Secondary | ICD-10-CM

## 2018-12-26 ENCOUNTER — Other Ambulatory Visit: Payer: Self-pay | Admitting: Adult Health

## 2018-12-26 DIAGNOSIS — F419 Anxiety disorder, unspecified: Secondary | ICD-10-CM

## 2019-01-22 ENCOUNTER — Other Ambulatory Visit: Payer: Self-pay | Admitting: Adult Health

## 2019-01-22 DIAGNOSIS — F419 Anxiety disorder, unspecified: Secondary | ICD-10-CM

## 2019-01-22 NOTE — Progress Notes (Deleted)
Assessment and Plan:  Makayla [name] was seen today for medication refill.  Diagnoses and all orders for this visit:  Patient is self-pay  Depression/anxiety Continue medications; continue limiting xanax use as much as possible, and doing well with this Lifestyle discussed: diet/exerise, sleep hygiene, stress management, hydration Anticipate getting out of stressful home situation is critical to improving her symptoms -     imipramine (TOFRANIL) 50 MG tablet; Take 3 tablets (150 mg total) by mouth at bedtime.  Obsessive-compulsive disorder, unspecified type Continue with celexa; she reports significantly improved depressive symptoms/mood since starting Continue to work towards finding a specialist for OCD management   Migraines Doing well in past year  Further disposition pending results of labs. Discussed med's effects and SE's.   Over 30 minutes of exam, counseling, chart review, and critical decision making was performed.   Future Appointments  Date Time Provider Department Center  01/23/2019  2:30 PM Judd Gaudierorbett, Gladstone Rosas, NP [facility] None    ------------------------------------------------------------------------------------------------------------------   HPI There were no vitals taken for this visit.  38 y.o.female with depression, anxiety, OCD (compulsive handwashing under stress) and migraines presents for follow up. She reports she has been in contact with Makayla [name] with mental health resources to be referred to mental health counseling resources. She is also considering [name]. She was previously seeing Makayla [name] but has disagreements and was too costly.   She has been working on lifestyle as discussed at last visit, but admits she still struggles to eat regularly due to stress and home environment, and is still working on hydration. Still living with dad and step-mom who served her an eviction notice in August but father stepped in. She does admit to headaches 1-2 times weekly, but  typically improves with OTC analgesics, flexeril. She continues to work on home/housing situation and is applying for housing assistance and may possibly move in with a coworker as a roommate.   she has a diagnosis of anxiety/OCD and is currently on celexa 40 mg, tofranil 150 mg nightly and reports symptoms are fairly controlled on current regimen. she reports she typically takes 1 mg xanax prior to her work shift and 0.5 mg later during her shift on days that she works, and tries to avoid taking on her days off unless has very stressful encounter with her step mom or with leaving home.   BMI is There is no height or weight on file to calculate BMI. She admits last week was particularly stressful with her mother and couldn't stomach much but is feeling better Wt Readings from Last 3 Encounters:  11/30/18 133 lb (60.3 kg)  07/20/18 129 lb (58.5 kg)  02/08/18 136 lb (61.7 kg)    Past Medical History:  Diagnosis Date  . Allergy   . Anxiety   . Asthma   . Depression   . Hyperlipidemia   . IBS (irritable bowel syndrome)   . Migraines   . OCD (obsessive compulsive disorder)   . TMJ syndrome      Allergies  Allergen Reactions  . Zoloft [Sertraline Hcl]     Current Outpatient Medications on File Prior to Visit  Medication Sig  . ALPRAZolam (XANAX) 0.5 MG tablet TAKE 1/2 TO 1 TABLET BY MOUTH 2 OR 3 TIMES A DAY ONLY IF NEEDED FOR ANXIETY ATTACK. LIMIT TO 5 DAYS A WEEK TO AVOID ADDICTION  . citalopram (CELEXA) 40 MG tablet TAKE ONE TABLET BY MOUTH DAILY  . cyclobenzaprine (FLEXERIL) 10 MG tablet TAKE ONE TABLET BY MOUTH THREE TIMES A  DAY AS NEEDED FOR MUSCLE SPASMS  . imipramine (TOFRANIL) 50 MG tablet Take 3 tablets (150 mg total) by mouth at bedtime.  . metoCLOPramide (REGLAN) 5 MG tablet Take 1 tablet (5 mg total) by mouth every 8 (eight) hours as needed for nausea (migraines).  . promethazine (PHENERGAN) 25 MG tablet Take 1 tablet (25 mg total) by mouth every 6 (six) hours as needed (for  migraines, can cause fatigue). Max: 4 tablets per day   No current facility-administered medications on file prior to visit.     ROS: all negative except above.   Physical Exam:  There were no vitals taken for this visit.  General Appearance: Well nourished, in no apparent distress. Eyes: PERRLA, EOMs, conjunctiva no swelling or erythema Sinuses: No Frontal/maxillary tenderness ENT/Mouth: Ext aud canals clear, TMs without erythema, bulging. No erythema, swelling, or exudate on post pharynx.  Tonsils not swollen or erythematous. Hearing normal.  Neck: Supple, thyroid normal.  Respiratory: Respiratory effort normal, BS equal bilaterally without rales, rhonchi, wheezing or stridor.  Cardio: RRR with no MRGs. Brisk peripheral pulses without edema.  Abdomen: Soft, + BS.  Non tender, no guarding, rebound, hernias, masses. Lymphatics: Non tender without lymphadenopathy.  Musculoskeletal: Symmetrical strength, normal gait.  Skin: Warm, dry; bilateral hands very dry/chapped/flaky  Neuro: Cranial nerves intact. Normal muscle tone, no cerebellar symptoms. Sensation intact.  Psych: Awake and oriented X 3, normal affect, Insight and Judgment appropriate.     [name], NP 4:54 PM [facility]

## 2019-01-23 ENCOUNTER — Ambulatory Visit: Payer: Self-pay | Admitting: Adult Health

## 2019-02-22 ENCOUNTER — Other Ambulatory Visit: Payer: Self-pay | Admitting: Adult Health

## 2019-02-22 DIAGNOSIS — F419 Anxiety disorder, unspecified: Secondary | ICD-10-CM

## 2019-02-27 ENCOUNTER — Telehealth: Payer: Self-pay

## 2019-02-27 ENCOUNTER — Other Ambulatory Visit: Payer: Self-pay | Admitting: Adult Health

## 2019-02-27 NOTE — Telephone Encounter (Signed)
Patient states that the directions changed on her prescription for Alprazolam and she was unaware, now she doesn't have enough. Please advise.

## 2019-02-27 NOTE — Telephone Encounter (Signed)
LMTCB

## 2019-02-27 NOTE — Telephone Encounter (Signed)
Patient aware and will have the pharmacy resend [place] a request if need be

## 2019-03-25 ENCOUNTER — Other Ambulatory Visit: Payer: Self-pay | Admitting: Adult Health

## 2019-03-25 DIAGNOSIS — F419 Anxiety disorder, unspecified: Secondary | ICD-10-CM

## 2019-03-25 DIAGNOSIS — F339 Major depressive disorder, recurrent, unspecified: Secondary | ICD-10-CM

## 2019-03-27 ENCOUNTER — Other Ambulatory Visit: Payer: Self-pay | Admitting: Internal Medicine

## 2019-03-27 ENCOUNTER — Other Ambulatory Visit: Payer: Self-pay | Admitting: Adult Health

## 2019-03-27 DIAGNOSIS — G43809 Other migraine, not intractable, without status migrainosus: Secondary | ICD-10-CM

## 2019-04-05 ENCOUNTER — Other Ambulatory Visit: Payer: Self-pay | Admitting: Adult Health

## 2019-04-05 DIAGNOSIS — F419 Anxiety disorder, unspecified: Secondary | ICD-10-CM

## 2019-04-06 ENCOUNTER — Other Ambulatory Visit: Payer: Self-pay | Admitting: Adult Health

## 2019-04-06 DIAGNOSIS — F419 Anxiety disorder, unspecified: Secondary | ICD-10-CM

## 2019-05-14 ENCOUNTER — Other Ambulatory Visit: Payer: Self-pay | Admitting: Internal Medicine

## 2019-05-14 DIAGNOSIS — G43809 Other migraine, not intractable, without status migrainosus: Secondary | ICD-10-CM

## 2019-05-14 MED ORDER — CYCLOBENZAPRINE HCL 10 MG PO TABS: ORAL_TABLET | ORAL | 2 refills | Status: DC

## 2019-05-14 NOTE — Progress Notes (Deleted)
Assessment and Plan:  Makayla [name] was seen today for medication refill.  Diagnoses and all orders for this visit:  Patient is self-pay  Depression/anxiety Continue medications; continue limiting xanax use as much as possible, and doing well with this Lifestyle discussed: diet/exerise, sleep hygiene, stress management, hydration Anticipate getting out of stressful home situation is critical to improving her symptoms -     imipramine (TOFRANIL) 50 MG tablet; Take 3 tablets (150 mg total) by mouth at bedtime.  Obsessive-compulsive disorder, unspecified type Continue with celexa; she reports significantly improved depressive symptoms/mood since starting Continue to work towards finding a specialist for OCD management   Migraines ***  Further disposition pending results of labs. Discussed med's effects and SE's.   Over 30 minutes of exam, counseling, chart review, and critical decision making was performed.   Future Appointments  Date Time Provider [place]  05/16/2019  3:30 PM Makayla [name] [name] None    ------------------------------------------------------------------------------------------------------------------   HPI There were no vitals taken for this visit.  38 y.o.female with depression, anxiety, OCD (compulsive handwashing under stress) and migraines presents for follow up. She is self pay ***  She reports she has been in contact with Makayla [name] with mental health resources to be referred to mental health counseling resources. She is also considering [name].  She was previously seeing Dr. Toy Care but has disagreements and was too costly.   She has been working on lifestyle as discussed at last visit, but admits she still struggles to eat regularly due to stress and home environment, and is still working on hydration. Still living with dad and step-mom who served her an eviction notice in August but father stepped in. She does admit to headaches 1-2 times weekly,  but typically improves with OTC analgesics, flexeril. She continues to work on home/housing situation and is applying for housing assistance and may possibly move in with a coworker as a roommate.   she has a diagnosis of anxiety/OCD and is currently on celexa 40 mg, tofranil 150 mg nightly and reports symptoms are fairly controlled on current regimen. she reports she typically takes 1 mg xanax prior to her work shift and 0.5 mg later during her shift on days that she works, and tries to avoid taking on her days off unless has very stressful encounter with her step mom or with leaving home.   Migraines ***  BMI is There is no height or weight on file to calculate BMI. She admits last week was particularly stressful with her mother and couldn't stomach much but is feeling better Wt Readings from Last 3 Encounters:  11/30/18 133 lb (60.3 kg)  07/20/18 129 lb (58.5 kg)  02/08/18 136 lb (61.7 kg)    Past Medical History:  Diagnosis Date  . Allergy   . Anxiety   . Asthma   . Depression   . Hyperlipidemia   . IBS (irritable bowel syndrome)   . Migraines   . OCD (obsessive compulsive disorder)   . TMJ syndrome      Allergies  Allergen Reactions  . Zoloft [Sertraline Hcl]     Current Outpatient Medications on File Prior to Visit  Medication Sig  . ALPRAZolam (XANAX) 0.5 MG tablet TAKE 1/2 TO 1 TABLET BY MOUTH TWO TO THREE TIMES A DAY ONLY IF NEEDED FOR ANXIETY *LIMIT TO 5 DAYS PER WEEK*  . citalopram (CELEXA) 40 MG tablet Take 1 tablet Daily for Mood  . cyclobenzaprine (FLEXERIL) 10 MG tablet Take 1/2 to 1 tablet 1  to 2 x /day as needed for Muscle Spasms  . imipramine (TOFRANIL) 50 MG tablet Take 3 tablets (150 mg total) by mouth at bedtime.  . metoCLOPramide (REGLAN) 5 MG tablet Take 1 tablet (5 mg total) by mouth every 8 (eight) hours as needed for nausea (migraines).  . promethazine (PHENERGAN) 25 MG tablet Take 1 tablet (25 mg total) by mouth every 6 (six) hours as needed (for  migraines, can cause fatigue). Max: 4 tablets per day   No current facility-administered medications on file prior to visit.     ROS: all negative except above.   Physical Exam:  There were no vitals taken for this visit.  General Appearance: Well nourished, in no apparent distress. Eyes: PERRLA, EOMs, conjunctiva no swelling or erythema Sinuses: No Frontal/maxillary tenderness ENT/Mouth: Ext aud canals clear, TMs without erythema, bulging. No erythema, swelling, or exudate on post pharynx.  Tonsils not swollen or erythematous. Hearing normal.  Neck: Supple, thyroid normal.  Respiratory: Respiratory effort normal, BS equal bilaterally without rales, rhonchi, wheezing or stridor.  Cardio: RRR with no MRGs. Brisk peripheral pulses without edema.  Abdomen: Soft, + BS.  Non tender, no guarding, rebound, hernias, masses. Lymphatics: Non tender without lymphadenopathy.  Musculoskeletal: Symmetrical strength, normal gait.  Skin: Warm, dry; bilateral hands very dry/chapped/flaky  Neuro: Cranial nerves intact. Normal muscle tone, no cerebellar symptoms. Sensation intact.  Psych: Awake and oriented X 3, normal affect, Insight and Judgment appropriate.     Makayla MakerAshley C Lanyah Spengler, [name] 6:02 PM [facility]

## 2019-05-16 ENCOUNTER — Ambulatory Visit: Payer: Self-pay | Admitting: Adult Health

## 2019-05-31 ENCOUNTER — Other Ambulatory Visit: Payer: Self-pay | Admitting: Internal Medicine

## 2019-05-31 DIAGNOSIS — F419 Anxiety disorder, unspecified: Secondary | ICD-10-CM

## 2019-05-31 DIAGNOSIS — F339 Major depressive disorder, recurrent, unspecified: Secondary | ICD-10-CM

## 2019-06-06 NOTE — Progress Notes (Deleted)
Assessment and Plan:  Makayla [name] was seen today for medication refill.  Diagnoses and all orders for this visit:  Patient is self-pay  Depression/anxiety Continue medications; continue limiting xanax use as much as possible, and doing well with this Lifestyle discussed: diet/exerise, sleep hygiene, stress management, hydration Anticipate getting out of stressful home situation is critical to improving her symptoms -     imipramine (TOFRANIL) 50 MG tablet; Take 3 tablets (150 mg total) by mouth at bedtime.  Obsessive-compulsive disorder, unspecified type Continue with celexa; she reports significantly improved depressive symptoms/mood since starting Continue to work towards finding a specialist for OCD management   Further disposition pending results of labs. Discussed med's effects and SE's.   Over 30 minutes of exam, counseling, chart review, and critical decision making was performed.   Future Appointments  Date Time Provider Department Center  06/07/2019  9:30 AM Quentin Mullingollier, Damika Harmon, PA-C [facility] None    ------------------------------------------------------------------------------------------------------------------   HPI There were no vitals taken for this visit.  38 y.o.female with depression, anxiety, OCD (compulsive handwashing under stress) and migraines presents for follow up.   She reports she has been in contact with Makayla [name] with mental health resources to be referred to mental health counseling resources. She is also considering [name]. She was previously seeing Makayla [name] but has disagreements and was too costly.   She lives with her dad and step mom.  She does admit to headaches 1-2 times weekly, but typically improves with OTC analgesics, flexeril. She continues to work on home/housing situation and is applying for housing assistance and may possibly move in with a coworker as a roommate.   she has a diagnosis of anxiety/OCD and is currently on celexa 40 mg, tofranil 150  mg nightly and reports symptoms are fairly controlled on current regimen. she reports she typically takes 1 mg xanax prior to her work shift and 0.5 mg later during her shift on days that she works, and tries to avoid taking on her days off unless has very stressful encounter with her step mom or with leaving home.   BMI is There is no height or weight on file to calculate BMI.  Wt Readings from Last 3 Encounters:  11/30/18 133 lb (60.3 kg)  07/20/18 129 lb (58.5 kg)  02/08/18 136 lb (61.7 kg)    Past Medical History:  Diagnosis Date  . Allergy   . Anxiety   . Asthma   . Depression   . Hyperlipidemia   . IBS (irritable bowel syndrome)   . Migraines   . OCD (obsessive compulsive disorder)   . TMJ syndrome      Allergies  Allergen Reactions  . Zoloft [Sertraline Hcl]     Current Outpatient Medications on File Prior to Visit  Medication Sig  . ALPRAZolam (XANAX) 0.5 MG tablet TAKE 1/2 TO 1 TABLET BY MOUTH TWO TO THREE TIMES A DAY ONLY IF NEEDED FOR ANXIETY *LIMIT TO 5 DAYS PER WEEK*  . citalopram (CELEXA) 40 MG tablet Take 1 tablet Daily for Mood  . cyclobenzaprine (FLEXERIL) 10 MG tablet Take 1/2 to 1 tablet 1 to 2 x /day as needed for Muscle Spasms  . imipramine (TOFRANIL) 50 MG tablet Take 3 tablets (150 mg total) by mouth at bedtime.  . metoCLOPramide (REGLAN) 5 MG tablet Take 1 tablet (5 mg total) by mouth every 8 (eight) hours as needed for nausea (migraines).  . promethazine (PHENERGAN) 25 MG tablet Take 1 tablet (25 mg total) by mouth every 6 (six)  hours as needed (for migraines, can cause fatigue). Max: 4 tablets per day   No current facility-administered medications on file prior to visit.     ROS: all negative except above.   Physical Exam:  There were no vitals taken for this visit.  General Appearance: Well nourished, in no apparent distress. Eyes: PERRLA, EOMs, conjunctiva no swelling or erythema Sinuses: No Frontal/maxillary tenderness ENT/Mouth: Ext aud  canals clear, TMs without erythema, bulging. No erythema, swelling, or exudate on post pharynx.  Tonsils not swollen or erythematous. Hearing normal.  Neck: Supple, thyroid normal.  Respiratory: Respiratory effort normal, BS equal bilaterally without rales, rhonchi, wheezing or stridor.  Cardio: RRR with no MRGs. Brisk peripheral pulses without edema.  Abdomen: Soft, + BS.  Non tender, no guarding, rebound, hernias, masses. Lymphatics: Non tender without lymphadenopathy.  Musculoskeletal: Symmetrical strength, normal gait.  Skin: Warm, dry; bilateral hands very dry/chapped/flaky  Neuro: Cranial nerves intact. Normal muscle tone, no cerebellar symptoms. Sensation intact.  Psych: Awake and oriented X 3, normal affect, Insight and Judgment appropriate.     Vicie Mutters, PA-C 1:25 PM [facility]

## 2019-06-07 ENCOUNTER — Ambulatory Visit: Payer: Self-pay | Admitting: Physician Assistant

## 2019-06-13 ENCOUNTER — Encounter: Payer: Self-pay | Admitting: Physician Assistant

## 2019-06-13 ENCOUNTER — Other Ambulatory Visit: Payer: Self-pay

## 2019-06-13 ENCOUNTER — Ambulatory Visit (INDEPENDENT_AMBULATORY_CARE_PROVIDER_SITE_OTHER): Payer: Self-pay | Admitting: Physician Assistant

## 2019-06-13 VITALS — BP 116/78 | HR 65 | Temp 97.6°F | Ht 67.5 in | Wt 156.0 lb

## 2019-06-13 DIAGNOSIS — Z79899 Other long term (current) drug therapy: Secondary | ICD-10-CM

## 2019-06-13 DIAGNOSIS — F429 Obsessive-compulsive disorder, unspecified: Secondary | ICD-10-CM

## 2019-06-13 DIAGNOSIS — G43909 Migraine, unspecified, not intractable, without status migrainosus: Secondary | ICD-10-CM

## 2019-06-13 DIAGNOSIS — R5383 Other fatigue: Secondary | ICD-10-CM

## 2019-06-13 MED ORDER — VENLAFAXINE HCL ER 75 MG PO CP24: 75.0000 mg | ORAL_CAPSULE | Freq: Every day | ORAL | 2 refills | Status: DC

## 2019-06-13 NOTE — Progress Notes (Signed)
Assessment and Plan: Obsessive-compulsive disorder, unspecified type with migraines and depression Will switch from celexa to effexor to see if we can help multiple things If fails or does not help can try prozac Close follow up 2 weeks No SI/HI Given information -     venlafaxine XR (EFFEXOR XR) 75 MG 24 hr capsule; Take 1 capsule (75 mg total) by mouth daily.  Medication management -     COMPLETE METABOLIC PANEL WITH GFR  Fatigue, unspecified type -     TSH   Future Appointments  Date Time Provider [place]  06/28/2019  3:30 PM Vicie Mutters, PA-C [facility] None     HPI 38 y.o.female presents for depression.    She has a history of OCD/anxiety, migraines, she ran out of imipramine 150mg , started back July 23rd due to increasing headaches. And she ran out of the celexa x 1 week.   She states she has had worsening mood swings, crying a lot.  She is very aloof and difficult to get questions from.  She states her mom died September 06, [address] years ago and she got in trouble at work, states being rude to customers.  She has ongoing OCD (excessive handwashing)/anxiety -  takes 3 tabs xanax on days that she's at work, but on days that she is off typically takes 1-2 as needed.      Past Medical History:  Diagnosis Date  . Allergy   . Anxiety   . Asthma   . Depression   . Hyperlipidemia   . IBS (irritable bowel syndrome)   . Migraines   . OCD (obsessive compulsive disorder)   . TMJ syndrome      Allergies  Allergen Reactions  . Zoloft [Sertraline Hcl]     Current Outpatient Medications on File Prior to Visit  Medication Sig  . ALPRAZolam (XANAX) 0.5 MG tablet TAKE 1/2 TO 1 TABLET BY MOUTH TWO TO THREE TIMES A DAY ONLY IF NEEDED FOR ANXIETY *LIMIT TO 5 DAYS PER WEEK*  . cyclobenzaprine (FLEXERIL) 10 MG tablet Take 1/2 to 1 tablet 1 to 2 x /day as needed for Muscle Spasms  . metoCLOPramide (REGLAN) 5 MG tablet Take 1 tablet (5 mg total) by mouth every 8  (eight) hours as needed for nausea (migraines).  . promethazine (PHENERGAN) 25 MG tablet Take 1 tablet (25 mg total) by mouth every 6 (six) hours as needed (for migraines, can cause fatigue). Max: 4 tablets per day   No current facility-administered medications on file prior to visit.     ROS: all negative except above.   Physical Exam: Filed Weights   06/13/19 1421  Weight: 156 lb (70.8 kg)   BP 116/78   Pulse 65   Temp 97.6 F (36.4 C)   Ht 5' 7.5" (1.715 m)   Wt 156 lb (70.8 kg)   LMP 06/08/2019   SpO2 96%   BMI 24.07 kg/m  General Appearance: Well nourished, in no apparent distress. Eyes: PERRLA, EOMs, conjunctiva no swelling or erythema Sinuses: No Frontal/maxillary tenderness ENT/Mouth: Ext aud canals clear, TMs without erythema, bulging. No erythema, swelling, or exudate on post pharynx.  Tonsils not swollen or erythematous. Hearing normal.  Neck: Supple, thyroid normal.  Respiratory: Respiratory effort normal, BS equal bilaterally without rales, rhonchi, wheezing or stridor.  Cardio: RRR with no MRGs. Brisk peripheral pulses without edema.  Abdomen: Soft, + BS.  Non tender, no guarding, rebound, hernias, masses. Lymphatics: Non tender without lymphadenopathy.  Musculoskeletal: Full ROM, 5/5  strength, normal gait.  Skin: Bilateral hands with erythema, scaling, no warmth, swelling. Warm, dry without rashes, lesions, ecchymosis.  Neuro: Cranial nerves intact. Normal muscle tone, no cerebellar symptoms. Sensation intact.  Psych: Awake and oriented X 3, normal affect, Insight and Judgment appropriate.     Quentin MullingAmanda Philopateer Strine, PA-C 9:08 AM [facility]

## 2019-06-13 NOTE — Patient Instructions (Addendum)
Try to go back to [place]  Stop the celexa and the imipramine and try the effexor The effexor is good for migraine prevention, depression and anxiety as well as some OCD I can send in a 75mg  a day effexor, do this for 2 weeks and will follow up    Persistent Depressive Disorder, Adult Persistent depressive disorder (PDD) is a mental health condition that causes symptoms of low-level depression for 2 years or longer. It may also be called long-term (chronic) depression or dysthymia. PDD may include episodes of more severe depression that last for about 2 weeks (major depressive disorder or MDD). PDD can affect the way you think, feel, and sleep. This condition may also affect your relationships. You may be more likely to get sick if you have PDD. What are the causes? The exact cause of this condition is not known. PDD is most likely caused by a combination of things, which may include:  Genetic factors. These are traits that are passed along from parent to child.  Individual factors. Your personality, your behavior, and the way you handle your thoughts and feelings may contribute to PDD. This includes personality traits and behaviors learned from others.  Physical factors, such as: ? Differences in the part of your brain that controls emotion. This part of your brain may be different than it is in people who do not have PDD. ? Long-term (chronic) medical or psychiatric illnesses.  Social factors. Traumatic experiences or major life changes may play a role in the development of PDD. What increases the risk? This condition is more likely to develop in women. The following factors may make you more likely to develop PDD:  A family history of depression.  Abnormally low levels of certain brain chemicals.  Traumatic events in childhood, especially abuse or the loss of a parent.  Being under a lot of stress, or long-term stress, especially from upsetting life experiences or losses.  A  history of: ? Chronic physical illness. ? Other mental health disorders. ? Substance abuse.  Poor living conditions.  Experiencing social exclusion or discrimination on a regular basis. What are the signs or symptoms? Symptoms of this condition occur for most of the day, and may include:  Fatigue or low energy.  Eating too much or too little.  Sleeping too much or too little.  Restlessness or agitation.  Feelings of hopelessness.  Feeling worthless or guilty.  Anxiety.  Poor concentration or difficulty making decisions.  Low self-esteem.  Negative outlook.  Inability to have fun or experience pleasure.  Social withdrawal.  Unexplained physical complaints.  Irritability.  Aggressive behavior or anger. How is this diagnosed? This condition may be diagnosed based on:  Your symptoms.  Your medical history, including your mental health history. This may involve tests to evaluate your mental health. You may be asked questions about your lifestyle, including any drug and alcohol use, and how long you have had symptoms of PDD.  A physical exam.  Blood tests to rule out other conditions. You may be diagnosed with PDD if you have had a depressed mood for 2 years or longer, as well as other symptoms of depression. How is this treated? This condition is usually treated by mental health professionals, such as psychologists, psychiatrists, and clinical social workers. You may need more than one type of treatment. Treatment may include:  Psychotherapy. This is also called talk therapy or counseling. Types of psychotherapy include: ? Cognitive behavioral therapy (CBT). This type of therapy teaches you  to recognize unhealthy feelings, thoughts, and behaviors, and replace them with positive thoughts and actions. ? Interpersonal therapy (IPT). This helps you to improve the way you relate to and communicate with others. ? Family therapy. This treatment includes members of your  family.  Medicine to treat anxiety and depression, or to help you control certain emotions and behaviors.  Lifestyle changes, such as: ? Limiting alcohol and drug use. ? Exercising regularly. ? Getting plenty of sleep. ? Making healthy eating choices. ? Spending more time outdoors.  Follow these instructions at home: Activity  Return to your normal activities as told by your health care provider.  Exercise regularly and spend time outdoors as told by your health care provider. General instructions  Take over-the-counter and prescription medicines only as told by your health care provider.  Do not drink alcohol. If you drink alcohol, limit your alcohol intake to no more than 1 drink a day for nonpregnant women and 2 drinks a day for men. One drink equals 12 oz of beer, 5 oz of wine, or 1 oz of hard liquor. Alcohol can affect any antidepressant medicines you are taking. Talk to your health care provider about your alcohol use.  Eat a healthy diet and get plenty of sleep.  Find activities that you enjoy doing, and make time to do them.  Consider joining a support group. Your health care provider may be able to recommend a support group.  Keep all follow-up visits as told by your health care provider. This is important. Where to find more information [employer] on Mental Illness  www.nami.org [place] of Mental Health  http://www.maynard.net/ National Suicide Prevention Lifeline  1-800-273-TALK 979-792-0051). This is free, 24-hour help. Contact a health care provider if:  Your symptoms get worse.  You develop new symptoms.  You have trouble sleeping or doing your daily activities. Get help right away if:  You self-harm.  You have serious thoughts about hurting yourself or others.  You see, hear, taste, smell, or feel things that are not present (hallucinate). This information is not intended to replace advice given to you by your health care provider. Make  sure you discuss any questions you have with your health care provider. Document Released: 09/06/2012 Document Revised: 09/02/2017 Document Reviewed: 04/03/2016 Elsevier Patient Education  2020 Elsevier Inc.   Proximal Biceps Tendinitis and Tenosynovitis Rehab Ask your health care provider which exercises are safe for you. Do exercises exactly as told by your health care provider and adjust them as directed. It is normal to feel mild stretching, pulling, tightness, or discomfort as you do these exercises. Stop right away if you feel sudden pain or your pain gets worse. Do not begin these exercises until told by your health care provider. Stretching and range-of-motion exercises These exercises warm up your muscles and joints and improve the movement and flexibility of your arm and shoulder. The exercises also help to relieve pain and stiffness. Forearm rotation 1. Stand or sit with your left / right elbow bent in a 90-degree (right angle). Position your forearm so that the thumb is facing the ceiling (neutral position). 2. Rotate your palm up until it cannot go any farther. 3. Hold this position for __________ seconds. 4. Rotate your palm down until it cannot go any farther. 5. Hold this position for __________ seconds. Repeat __________ times. Complete this exercise __________ times a day. Elbow range of motion 1. Stand or sit with your left / right elbow bent in a 90-degree  angle (right angle). Position your forearm so that the thumb is facing the ceiling (neutral position). 2. Slowly bend your elbow as far as you can until you feel a stretch or cannot go any farther. 3. Hold this position for __________ seconds. 4. Slowly straighten your elbow as far as you can until you feel a stretch or cannot go any farther. 5. Hold this position for __________ seconds. Repeat __________ times. Complete this exercise __________ times a day. Biceps stretch 1. Stand facing a wall, or stand by a door  frame. 2. Raise your left / right arm out to your side, to your shoulder height. Place the thumb side of your hand against the wall. Your palm should be facing the floor (palm down). 3. Keeping your arm straight, rotate your body in the opposite direction of the raised arm until you feel a gentle stretch in your biceps. 4. Hold this position for __________ seconds. 5. Slowly return to the starting position. Repeat __________ times. Complete this exercise __________ times a day. Shoulder pendulum  1. Stand near a table or counter that you can hold onto for balance. 2. Bend forward at the waist and let your left / right arm hang straight down. Use your other arm to support you and help you stay balanced. 3. Relax your left / right arm and shoulder muscles, and move your hips and your trunk so your left / right arm swings freely. Your arm should swing because of the motion of your body, not because you are using your arm or shoulder muscles. 4. Keep moving your hips and trunk so your arm swings in the following directions, as told by your health care provider: ? Side to side. ? Forward and backward. ? In clockwise and counterclockwise circles. Repeat __________ times. Complete this exercise __________ times a day. Shoulder flexion, assisted  1. Stand facing a wall. Put your left / right palm on the wall. 2. Slowly move your left / right hand up the wall (flexion). Stop when you feel a stretch in your shoulder, or when you reach the angle that is recommended by your health care provider. ? Use your other hand to help raise your arm, if needed (assisted). ? As your hand gets higher, you may need to step closer to the wall. ? Avoid shrugging or lifting your shoulder up as you raise your arm. To do this, keep your shoulder blade tucked down toward your spine. 3. Hold this position for __________ seconds. 4. Slowly return to the starting position. Use your other arm to help, if needed. Repeat  __________ times. Complete this exercise __________ times a day. Shoulder flexion 1. Stand with your left / right arm hanging down at your side. 2. Keep your arm straight as you lift your arm forward and toward the ceiling (flexion). 3. Hold this position for __________ seconds. 4. Slowly return to the starting position. Repeat __________ times. Complete this exercise __________ times a day. Sleeper stretch, assisted 1. Lie on your left / right side (injured side) with your hips and knees bent and your left / right arm straight in front of you. 2. Bend your elbow to a 90-degree angle (right angle), so your fingers are pointing to the ceiling. 3. Use your other hand to gently push your arm toward the floor (assisted), stopping when you feel a gentle stretch. ? Keep your shoulder blades lightly squeezed together during the exercise. 4. Hold this position for __________ seconds. 5. Slowly return to the  starting position. Repeat __________ times. Complete this exercise __________ times a day. Strengthening exercises These exercises build strength and endurance in your arm and shoulder. Endurance is the ability to use your muscles for a long time, even after they get tired. Biceps curls You can use a weight or an exercise band for this exercise. 1. Sit on a stable chair without armrests, or stand up. 2. Hold a __________ weight in your left / right hand, or hold an exercise band with both hands. Your palms should face up toward the ceiling at the starting position. 3. Bend your left / right elbow and move your hand up toward your shoulder. Keep your other arm straight down, in the starting position. 4. Hold this position for __________ seconds. 5. Slowly return to the starting position. Repeat __________ times. Complete this exercise __________ times a day. Internal shoulder rotation You will use an exercise band secured to a stable object at waist height for this exercise. A door and doorframe  work well. 1. Stand sideways next to a door with your left / right arm closest to the door, holding the exercise band in your hand. 2. With your elbow bent in a 90-degree angle (right angle) and keeping your elbow at your side, bring your hand toward your belly (internal rotation). ? Make sure your wrist is staying straight as you do this exercise. 3. Hold this position for __________ seconds. 4. Slowly return to the starting position. Repeat __________ times. Complete this exercise __________ times a day. External shoulder rotation You will use an exercise band secured to a stable object at waist height for this exercise. A door and doorframe work well. 1. Stand sideways next to a door with your left / right arm away from the door, holding the exercise band in your hand. 2. With your elbow bent in a 90-degree angle (right angle) and keeping your elbow at your side, swing your arm away from your body (external rotation). ? Make sure your wrist is staying straight as you do this exercise. 3. Hold this position for __________ seconds. 4. Slowly return to the starting position. Repeat __________ times. Complete this exercise __________ times a day. External shoulder rotation, side-lying You will use a weight to do this exercise. 1. Lie on your uninjured side with your left / right arm at your side. Bend your elbow to a 90-degree angle (right angle). Hold a __________ weight in your left / right hand. 2. Keeping your elbow at your side, raise your arm toward the ceiling (external rotation). ? Make sure your wrist is staying straight as you do this exercise. 3. Hold this position for __________ seconds. 4. Slowly return to the starting position. Repeat __________ times. Complete this exercise __________ times a day. Scapular retraction Scapular retraction is the process of pulling the shoulder blades (scapulae) toward each other, and toward the spine. You will need an exercise band to do this  exercise. 1. Sit in a stable chair without armrests, or stand up. 2. Secure an exercise band to a stable object in front of you so the band is at shoulder height. 3. Hold one end of the exercise band in each hand. 4. Squeeze your shoulder blades together and move your elbows slightly behind you (retraction). Do not shrug your shoulders upward while you do this. 5. Hold this position for __________ seconds. 6. Slowly return to the starting position. Repeat __________ times. Complete this exercise __________ times a day. Scapular protraction, supine Scapular protraction  is the process of moving your shoulder blades away from each other, and away from the spine, while you lie on your back (supine position). 1. Lie on your back on a firm surface. Hold a __________ weight in your left / right hand. 2. Raise your left / right arm straight into the air so your hand is directly above your shoulder joint. 3. Push the weight into the air so your shoulder (scapula) lifts off the surface that you are lying on. Think of trying to punch the ceiling by only moving your scapula forward (protraction). Do not move your head, neck, or back. 4. Hold this position for __________ seconds. 5. Slowly return to the starting position. Repeat __________ times. Complete this exercise __________ times a day. This information is not intended to replace advice given to you by your health care provider. Make sure you discuss any questions you have with your health care provider. Document Released: 09/20/2005 Document Revised: 01/16/2019 Document Reviewed: 10/02/2018 Elsevier Patient Education  2020 [employer].  0

## 2019-06-14 LAB — COMPLETE METABOLIC PANEL WITH GFR
AG Ratio: 1.7 (calc) (ref 1.0–2.5)
ALT: 14 U/L (ref 6–29)
AST: 18 U/L (ref 10–30)
Albumin: 4.2 g/dL (ref 3.6–5.1)
Alkaline phosphatase (APISO): 64 U/L (ref 31–125)
BUN: 12 mg/dL (ref 7–25)
CO2: 30 mmol/L (ref 20–32)
Calcium: 9.1 mg/dL (ref 8.6–10.2)
Chloride: 104 mmol/L (ref 98–110)
Creat: 0.76 mg/dL (ref 0.50–1.10)
GFR, Est African American: 115 mL/min/{1.73_m2} (ref >=60)
GFR, Est Non African American: 99 mL/min/{1.73_m2} (ref >=60)
Globulin: 2.5 g/dL (calc) (ref 1.9–3.7)
Glucose, Bld: 90 mg/dL (ref 65–99)
Potassium: 4.7 mmol/L (ref 3.5–5.3)
Sodium: 140 mmol/L (ref 135–146)
Total Bilirubin: 0.3 mg/dL (ref 0.2–1.2)
Total Protein: 6.7 g/dL (ref 6.1–8.1)

## 2019-06-14 LAB — TSH: TSH: 2.23 mIU/L

## 2019-06-18 ENCOUNTER — Other Ambulatory Visit: Payer: Self-pay | Admitting: Adult Health

## 2019-06-18 DIAGNOSIS — F419 Anxiety disorder, unspecified: Secondary | ICD-10-CM

## 2019-06-28 ENCOUNTER — Ambulatory Visit: Payer: Self-pay | Admitting: Physician Assistant

## 2019-07-17 ENCOUNTER — Other Ambulatory Visit: Payer: Self-pay | Admitting: Physician Assistant

## 2019-07-17 DIAGNOSIS — F419 Anxiety disorder, unspecified: Secondary | ICD-10-CM

## 2019-08-21 ENCOUNTER — Other Ambulatory Visit: Payer: Self-pay | Admitting: Internal Medicine

## 2019-08-21 DIAGNOSIS — G43809 Other migraine, not intractable, without status migrainosus: Secondary | ICD-10-CM

## 2019-08-22 ENCOUNTER — Other Ambulatory Visit: Payer: Self-pay | Admitting: Adult Health

## 2019-08-22 DIAGNOSIS — F419 Anxiety disorder, unspecified: Secondary | ICD-10-CM

## 2019-09-12 ENCOUNTER — Other Ambulatory Visit: Payer: Self-pay

## 2019-09-12 DIAGNOSIS — Z20822 Contact with and (suspected) exposure to covid-19: Secondary | ICD-10-CM

## 2019-09-13 ENCOUNTER — Other Ambulatory Visit: Payer: Self-pay | Admitting: Physician Assistant

## 2019-09-14 LAB — NOVEL CORONAVIRUS, NAA: SARS-CoV-2, NAA: DETECTED — AB

## 2019-09-24 ENCOUNTER — Telehealth: Payer: Self-pay

## 2019-09-24 ENCOUNTER — Other Ambulatory Visit: Payer: Self-pay | Admitting: Physician Assistant

## 2019-09-24 NOTE — Telephone Encounter (Signed)
LEFT message for return call in order to inform of work note

## 2019-09-24 NOTE — Telephone Encounter (Signed)
-----   Message from Vicie Mutters, [place] sent at 09/24/2019  1:09 PM EST ----- Regarding: RE: work note Contact: [phone] Can do 09/12/2019 for 2 weeks, date back Dec 23rd IF she meets all of the following criteria  To stop home isolation, you will need all three of the statements below.  1) You need to be fever free for 3 days WITHOUT tylenol.  2) Your symptoms such as cough, shortness of breath need to improve.  3) It needs to be at least 10 days since your symptoms first appeared    After you stop home isolation, please continue to wear a mask in public and continue hand washing and contact precautions.   Estill Bamberg  ----- Message ----- From: Elenor Quinones, CMA Sent: 09/24/2019   9:44 AM EST To: Vicie Mutters, PA-C Subject: work note                                      OFFICE NOTE:  Pt states she sent you a message on TUES about needing a work note due to being out of work DUE to [place].  Positive COVID on 09/12/2019.

## 2019-09-25 ENCOUNTER — Encounter: Payer: Self-pay | Admitting: Physician Assistant

## 2019-10-04 ENCOUNTER — Other Ambulatory Visit: Payer: Self-pay | Admitting: Adult Health

## 2019-10-04 ENCOUNTER — Other Ambulatory Visit: Payer: Self-pay | Admitting: Internal Medicine

## 2019-10-04 DIAGNOSIS — F419 Anxiety disorder, unspecified: Secondary | ICD-10-CM

## 2019-10-04 DIAGNOSIS — G43809 Other migraine, not intractable, without status migrainosus: Secondary | ICD-10-CM

## 2019-11-06 ENCOUNTER — Telehealth: Payer: Self-pay

## 2019-11-06 NOTE — Telephone Encounter (Signed)
-----   Message from [name], [place] sent at 11/05/2019  8:46 PM EST ----- Regarding: RE: OFFICE NOTE Contact: [phone] Suggest office visit to check labs.  Marchelle Folks ----- Message ----- From: Gregery Na, CMA Sent: 11/05/2019   4:01 PM EST To: [name], PA-C Subject: OFFICE NOTE                                    HAD COVID IN DEC  STILL having aching legs during & chest pain every now & then.  Please advise

## 2019-11-06 NOTE — Telephone Encounter (Signed)
Left a voice mail informing the patient that per the provider she should come into the office due to her concerns at this time. I also sent a message to front office informing them of this message and that I told the patient to call at when she was ready to schedule that suggested visit

## 2019-11-08 ENCOUNTER — Other Ambulatory Visit: Payer: Self-pay | Admitting: Adult Health

## 2019-11-08 DIAGNOSIS — F419 Anxiety disorder, unspecified: Secondary | ICD-10-CM

## 2019-11-29 ENCOUNTER — Encounter: Payer: Self-pay | Admitting: Physician Assistant

## 2019-11-30 ENCOUNTER — Other Ambulatory Visit: Payer: Self-pay | Admitting: Adult Health

## 2019-11-30 ENCOUNTER — Other Ambulatory Visit: Payer: Self-pay | Admitting: Physician Assistant

## 2019-11-30 ENCOUNTER — Telehealth: Payer: Self-pay

## 2019-11-30 DIAGNOSIS — G43809 Other migraine, not intractable, without status migrainosus: Secondary | ICD-10-CM

## 2019-11-30 NOTE — Telephone Encounter (Signed)
Patient has been made aware of the SS letter needed for a new SS card.

## 2019-12-04 NOTE — Progress Notes (Deleted)
  Assessment and Plan:    HPI 39 y.o.female self pay with history of anxiety, OCD, depression presents for chest pain.  She has positive COVID 09/12/2019.   Patient Active Problem List   Diagnosis Date Noted  . Medication management 02/24/2016  . Asthma   . IBS (irritable bowel syndrome)   . Migraines   . TMJ syndrome   . Depression   . Anxiety   . OCD (obsessive compulsive disorder)        Current Outpatient Medications (Respiratory):  .  promethazine (PHENERGAN) 25 MG tablet, TAKE 1 TABLET BY MOUTH EVERY 6 HOURS AS NEEDED FOR MIGRAINES *CAN CAUSE FATIGUE* MAX 4 TABLETS PER DAY    Current Outpatient Medications (Other):  Marland Kitchen  ALPRAZolam (XANAX) 0.5 MG tablet, TAKE 1/2 TO 1 (ONE-HALF TO ONE) TABLET BY MOUTH TWO TO THREE TIMES A DAY AS NEEDED FOR ANXIETY *NEEDS TO LAST LONGER THAN 30 DAYS* .  cyclobenzaprine (FLEXERIL) 10 MG tablet, Take 1/2 to 1 tablet 1 or 2 x /day if needed for Muscle Spasms .  metoCLOPramide (REGLAN) 5 MG tablet, Take 1 tablet (5 mg total) by mouth every 8 (eight) hours as needed for nausea (migraines). .  venlafaxine XR (EFFEXOR-XR) 75 MG 24 hr capsule, TAKE ONE CAPSULE BY MOUTH DAILY  Allergies  Allergen Reactions  . Zoloft [Sertraline Hcl]     ROS: all negative except above.   Physical Exam: There were no vitals filed for this visit. There were no vitals taken for this visit. General Appearance: Well nourished, in no apparent distress. Eyes: PERRLA, EOMs, conjunctiva no swelling or erythema Sinuses: No Frontal/maxillary tenderness ENT/Mouth: Ext aud canals clear, TMs without erythema, bulging. No erythema, swelling, or exudate on post pharynx.  Tonsils not swollen or erythematous. Hearing normal.  Neck: Supple, thyroid normal.  Respiratory: Respiratory effort normal, BS equal bilaterally without rales, rhonchi, wheezing or stridor.  Cardio: RRR with no MRGs. Brisk peripheral pulses without edema.  Abdomen: Soft, + BS.  Non tender, no guarding,  rebound, hernias, masses. Lymphatics: Non tender without lymphadenopathy.  Musculoskeletal: Full ROM, 5/5 strength, normal gait.  Skin: Warm, dry without rashes, lesions, ecchymosis.  Neuro: Cranial nerves intact. Normal muscle tone, no cerebellar symptoms. Sensation intact.  Psych: Awake and oriented X 3, normal affect, Insight and Judgment appropriate.     [name], PA-C 2:13 PM [facility]

## 2019-12-05 ENCOUNTER — Ambulatory Visit: Payer: Self-pay | Admitting: Physician Assistant

## 2019-12-06 ENCOUNTER — Other Ambulatory Visit: Payer: Self-pay | Admitting: Physician Assistant

## 2019-12-06 DIAGNOSIS — F419 Anxiety disorder, unspecified: Secondary | ICD-10-CM

## 2019-12-12 ENCOUNTER — Ambulatory Visit: Payer: Self-pay | Admitting: Adult Health

## 2019-12-19 NOTE — Progress Notes (Signed)
Assessment and Plan:  Obsessive-compulsive disorder/depression  with migraines/HA She reports fair control of mood/OCD headaches with new medication; continue  No SI/HI Given information Continue working with getting better housing arrangements Lifestyle discussed: diet/exerise, sleep hygiene, stress management, hydration -     venlafaxine XR (EFFEXOR XR) 75 MG 24 hr capsule; Take 1 capsule (75 mg total) by mouth daily.  Migraines/Headache Continue effexor; tylenol, advil, flexeril, phenergan PRN are working well for her - add with onset, lifestyle reviewed, needs to improve stress, sleep, hydration -     ketorolac (TORADOL) injection 60 mg -     promethazine (PHENERGAN) 25 MG tablet; TAKE 1 TABLET BY MOUTH EVERY 6 HOURS AS NEEDED FOR MIGRAINES *CAN CAUSE FATIGUE* MAX 4 TABLETS PER DAY   Future Appointments  Date Time Provider Department Center  12/20/2019  2:30 PM [name], NP [facility] None     HPI 39 y.o.female presents for depression/OCD/migraine follow up. She is self pay.    She has a history of OCD/anxiety, migraines she ran out of imipramine 150mg , started back July 23rd due to increasing headaches. And she ran out of the celexa x 1 week. She was prescribe effexor in an attempt to replace impripramine/celexa, she reports mood and headaches are similar with current medications.   She reports has had headache recently, has woken up with headache several days Typically across the forehead, feels like a band; denies accompaniments (photosensitivity, nausea) unless particularly painful (rare).   She reports current headache ongoing for 3 days; will take a flexeril, will take tylenol or aleve if going to work, will help significantly. Lying down and resting.   She states she has had worsening mood swings, crying a lot.  She is very aloof and difficult to get questions from.  She states her mom died 08/12/242 years ago and she got in trouble at work, states being  rude to customers.  She has ongoing OCD (excessive handwashing)/anxiety -  takes 3 tabs xanax on days that she's at work, but on days that she is off typically takes 1-2 as needed.      Past Medical History:  Diagnosis Date  . Allergy   . Anxiety   . Asthma   . Depression   . Hyperlipidemia   . IBS (irritable bowel syndrome)   . Migraines   . OCD (obsessive compulsive disorder)   . TMJ syndrome      Allergies  Allergen Reactions  . Zoloft [Sertraline Hcl]     Current Outpatient Medications on File Prior to Visit  Medication Sig  . ALPRAZolam (XANAX) 0.5 MG tablet TAKE 1/2 TO 1 TABLET BY MOUTH BY MOUTH 2 TO 3 TIMES PER DAY AS NEEDED FOR ANXIETY *NEEDS TO LAST LONGER THAN 30 DAYS*  . cyclobenzaprine (FLEXERIL) 10 MG tablet Take 1/2 to 1 tablet 1 or 2 x /day if needed for Muscle Spasms  . metoCLOPramide (REGLAN) 5 MG tablet Take 1 tablet (5 mg total) by mouth every 8 (eight) hours as needed for nausea (migraines).  . promethazine (PHENERGAN) 25 MG tablet TAKE 1 TABLET BY MOUTH EVERY 6 HOURS AS NEEDED FOR MIGRAINES *CAN CAUSE FATIGUE* MAX 4 TABLETS PER DAY  . venlafaxine XR (EFFEXOR-XR) 75 MG 24 hr capsule TAKE ONE CAPSULE BY MOUTH DAILY   No current facility-administered medications on file prior to visit.    ROS: all negative except above.   Physical Exam: There were no vitals filed for this visit. There were no vitals taken  for this visit. General Appearance: Well nourished, in no apparent distress. Eyes: PERRLA, EOMs, conjunctiva no swelling or erythema Sinuses: No Frontal/maxillary tenderness ENT/Mouth: Ext aud canals clear, TMs without erythema, bulging. No erythema, swelling, or exudate on post pharynx.  Tonsils not swollen or erythematous. Hearing normal.  Neck: Supple, thyroid normal.  Respiratory: Respiratory effort normal, BS equal bilaterally without rales, rhonchi, wheezing or stridor.  Cardio: RRR with no MRGs. Brisk peripheral pulses without edema.  Abdomen:  Soft, + BS.  Non tender, no guarding, rebound, hernias, masses. Lymphatics: Non tender without lymphadenopathy.  Musculoskeletal: Full ROM, 5/5 strength, normal gait.  Skin: Bilateral hands with erythema, scaling, no warmth, swelling. Warm, dry without rashes, lesions, ecchymosis.  Neuro: Cranial nerves intact. Normal muscle tone, no cerebellar symptoms. Sensation intact.  Psych: Awake and oriented X 3, normal affect, Insight and Judgment appropriate.     Izora Ribas, NP 1:57 PM [facility]

## 2019-12-20 ENCOUNTER — Encounter: Payer: Self-pay | Admitting: Adult Health

## 2019-12-20 ENCOUNTER — Other Ambulatory Visit: Payer: Self-pay

## 2019-12-20 ENCOUNTER — Ambulatory Visit (INDEPENDENT_AMBULATORY_CARE_PROVIDER_SITE_OTHER): Payer: Self-pay | Admitting: Adult Health

## 2019-12-20 VITALS — BP 122/76 | HR 97 | Temp 97.9°F | Ht 67.5 in | Wt 163.6 lb

## 2019-12-20 DIAGNOSIS — G43909 Migraine, unspecified, not intractable, without status migrainosus: Secondary | ICD-10-CM

## 2019-12-20 DIAGNOSIS — F339 Major depressive disorder, recurrent, unspecified: Secondary | ICD-10-CM

## 2019-12-20 DIAGNOSIS — Z79899 Other long term (current) drug therapy: Secondary | ICD-10-CM

## 2019-12-20 DIAGNOSIS — F429 Obsessive-compulsive disorder, unspecified: Secondary | ICD-10-CM

## 2019-12-20 DIAGNOSIS — F419 Anxiety disorder, unspecified: Secondary | ICD-10-CM

## 2019-12-20 MED ORDER — PROMETHAZINE HCL 25 MG PO TABS: ORAL_TABLET | ORAL | 0 refills | Status: DC

## 2019-12-20 MED ORDER — KETOROLAC TROMETHAMINE 60 MG/2ML IM SOLN
60.0000 mg | Freq: Once | INTRAMUSCULAR | Status: AC
  Administered 2019-12-20: 60 mg via INTRAMUSCULAR

## 2019-12-20 NOTE — Patient Instructions (Addendum)
Know what a healthy weight is for you (roughly BMI <25) and aim to maintain this  Aim for 7+ servings of fruits and vegetables daily  65-80+ fluid ounces of water or unsweet tea for healthy kidneys  Limit to max 1 drink of alcohol per day; avoid smoking/tobacco  Limit animal fats in diet for cholesterol and heart health - choose grass fed whenever available  Avoid highly processed foods, and foods high in saturated/trans fats  Aim for low stress - take time to unwind and care for your mental health  Aim for 150 min of moderate intensity exercise weekly for heart health, and weights twice weekly for bone health  Aim for 7-9 hours of sleep daily    Tension Headache, Adult A tension headache is a feeling of pain, pressure, or aching in the head that is often felt over the front and sides of the head. The pain can be dull, or it can feel tight (constricting). There are two types of tension headache:  Episodic tension headache. This is when the headaches happen fewer than 15 days a month.  Chronic tension headache. This is when the headaches happen more than 15 days a month during a 70-month period. A tension headache can last from 30 minutes to several days. It is the most common kind of headache. Tension headaches are not normally associated with nausea or vomiting, and they do not get worse with physical activity. What are the causes? The exact cause of this condition is not known. Tension headaches are often triggered by stress, anxiety, or depression. Other triggers include:  Alcohol.  Too much caffeine or caffeine withdrawal.  Respiratory infections, such as colds, flu, or sinus infections.  Dental problems or teeth clenching.  Tiredness (fatigue).  Holding your head and neck in the same position for a long period of time, such as while using a computer.  Smoking.  Arthritis of the neck. What are the signs or symptoms? Symptoms of this condition include:  A feeling of  pressure or tightness around the head.  Dull, aching head pain.  Pain over the front and sides of the head.  Tenderness in the muscles of the head, neck, and shoulders. How is this diagnosed? This condition may be diagnosed based on your symptoms, your medical history, and a physical exam. If your symptoms are severe or unusual, you may have imaging tests, such as a CT scan or an MRI of your head. Your vision may also be checked. How is this treated? This condition may be treated with lifestyle changes and with medicines that help relieve symptoms. Follow these instructions at home: Managing pain  Take over-the-counter and prescription medicines only as told by your health care provider.  When you have a headache, lie down in a dark, quiet room.  If directed, apply ice to the head and neck: ? Put ice in a plastic bag. ? Place a towel between your skin and the bag. ? Leave the ice on for 20 minutes, 2-3 times a day.  If directed, apply heat to the back of your neck as often as told by your health care provider. Use the heat source that your health care provider recommends, such as a moist heat pack or a heating pad. ? Place a towel between your skin and the heat source. ? Leave the heat on for 20-30 minutes. ? Remove the heat if your skin turns bright red. This is especially important if you are unable to feel pain, heat, or  cold. You may have a greater risk of getting burned. Eating and drinking  Eat meals on a regular schedule.  Limit alcohol intake to no more than 1 drink a day for nonpregnant women and 2 drinks a day for men. One drink equals 12 oz of beer, 5 oz of wine, or 1 oz of hard liquor.  Drink enough fluid to keep your urine pale yellow.  Decrease your caffeine intake, or stop using caffeine. Lifestyle  Get 7-9 hours of sleep each night, or get the amount of sleep recommended by your health care provider.  At bedtime, remove all electronic devices from your room.  Electronic devices include computers, phones, and tablets.  Find ways to manage your stress. Some things that can help relieve stress include: ? Exercise. ? Deep breathing exercises. ? Yoga. ? Listening to music. ? Positive mental imagery.  Try to sit up straight and avoid tensing your muscles.  Do not use any products that contain nicotine or tobacco, such as cigarettes and e-cigarettes. If you need help quitting, ask your health care provider. General instructions   Keep all follow-up visits as told by your health care provider. This is important.  Avoid any headache triggers. Keep a headache journal to help find out what may trigger your headaches. For example, write down: ? What you eat and drink. ? How much sleep you get. ? Any change to your diet or medicines. Contact a health care provider if:  Your headache does not get better.  Your headache comes back.  You are sensitive to sounds, light, or smells because of a headache.  You have nausea or you vomit.  Your stomach hurts. Get help right away if:  You suddenly develop a very severe headache along with any of the following: ? A stiff neck. ? Nausea and vomiting. ? Confusion. ? Weakness. ? Double vision or loss of vision. ? Shortness of breath. ? Rash. ? Unusual sleepiness. ? Fever. ? Trouble speaking. ? Pain in your eyes or ears. ? Trouble walking or balancing. ? Feeling faint or passing out. Summary  A tension headache is a feeling of pain, pressure, or aching in the head that is often felt over the front and sides of the head.  A tension headache can last from 30 minutes to several days. It is the most common kind of headache.  This condition may be diagnosed based on your symptoms, your medical history, and a physical exam.  This condition may be treated with lifestyle changes and with medicines that help relieve symptoms. This information is not intended to replace advice given to you by your  health care provider. Make sure you discuss any questions you have with your health care provider. Document Revised: 07/18/2019 Document Reviewed: 12/31/2016 Elsevier Patient Education  [place].

## 2019-12-31 ENCOUNTER — Telehealth: Payer: Self-pay

## 2019-12-31 NOTE — Telephone Encounter (Signed)
Patient states that she is under stress at work about reducing hours because of living situation. Needs a medical release until April 12th. Please advise.

## 2020-01-02 ENCOUNTER — Encounter: Payer: Self-pay | Admitting: Adult Health

## 2020-01-02 NOTE — Telephone Encounter (Signed)
Patient would like a letter if possible. Dates need to say 4/1-4/12.

## 2020-01-02 NOTE — Telephone Encounter (Signed)
Patient aware and will pick up at our front desk.

## 2020-01-03 ENCOUNTER — Other Ambulatory Visit: Payer: Self-pay | Admitting: Internal Medicine

## 2020-01-03 DIAGNOSIS — G43809 Other migraine, not intractable, without status migrainosus: Secondary | ICD-10-CM

## 2020-01-16 ENCOUNTER — Other Ambulatory Visit: Payer: Self-pay | Admitting: Adult Health

## 2020-01-16 DIAGNOSIS — F419 Anxiety disorder, unspecified: Secondary | ICD-10-CM

## 2020-02-14 ENCOUNTER — Other Ambulatory Visit: Payer: Self-pay | Admitting: Adult Health

## 2020-02-14 DIAGNOSIS — F419 Anxiety disorder, unspecified: Secondary | ICD-10-CM

## 2020-02-21 ENCOUNTER — Other Ambulatory Visit: Payer: Self-pay | Admitting: Adult Health

## 2020-02-21 DIAGNOSIS — G43809 Other migraine, not intractable, without status migrainosus: Secondary | ICD-10-CM

## 2020-03-01 ENCOUNTER — Other Ambulatory Visit: Payer: Self-pay | Admitting: Adult Health

## 2020-03-06 ENCOUNTER — Other Ambulatory Visit: Payer: Self-pay | Admitting: Physician Assistant

## 2020-03-14 ENCOUNTER — Other Ambulatory Visit: Payer: Self-pay | Admitting: Adult Health

## 2020-03-14 DIAGNOSIS — F419 Anxiety disorder, unspecified: Secondary | ICD-10-CM

## 2020-03-22 ENCOUNTER — Other Ambulatory Visit: Payer: Self-pay | Admitting: Internal Medicine

## 2020-03-22 DIAGNOSIS — G43809 Other migraine, not intractable, without status migrainosus: Secondary | ICD-10-CM

## 2020-04-17 ENCOUNTER — Other Ambulatory Visit: Payer: Self-pay | Admitting: Physician Assistant

## 2020-04-17 DIAGNOSIS — F419 Anxiety disorder, unspecified: Secondary | ICD-10-CM

## 2020-04-21 ENCOUNTER — Other Ambulatory Visit: Payer: Self-pay | Admitting: Adult Health

## 2020-04-21 ENCOUNTER — Other Ambulatory Visit: Payer: Self-pay | Admitting: Internal Medicine

## 2020-04-21 DIAGNOSIS — G43809 Other migraine, not intractable, without status migrainosus: Secondary | ICD-10-CM

## 2020-05-21 ENCOUNTER — Ambulatory Visit: Payer: Self-pay | Admitting: Physician Assistant

## 2020-05-26 ENCOUNTER — Other Ambulatory Visit: Payer: Self-pay | Admitting: Adult Health

## 2020-05-26 DIAGNOSIS — F419 Anxiety disorder, unspecified: Secondary | ICD-10-CM

## 2020-06-09 ENCOUNTER — Other Ambulatory Visit: Payer: Self-pay | Admitting: Internal Medicine

## 2020-06-09 ENCOUNTER — Other Ambulatory Visit: Payer: Self-pay | Admitting: Adult Health

## 2020-06-16 ENCOUNTER — Other Ambulatory Visit: Payer: Self-pay | Admitting: Internal Medicine

## 2020-06-16 DIAGNOSIS — G43809 Other migraine, not intractable, without status migrainosus: Secondary | ICD-10-CM

## 2020-06-20 NOTE — Progress Notes (Deleted)
Assessment and Plan:  Obsessive-compulsive disorder/depression  with migraines/HA She reports fair control of mood/OCD headaches with new medication; continue  No SI/HI Given information Continue working with getting better housing arrangements Lifestyle discussed: diet/exerise, sleep hygiene, stress management, hydration -     venlafaxine XR (EFFEXOR XR) 75 MG 24 hr capsule; Take 1 capsule (75 mg total) by mouth daily.  Migraines/Headache Continue effexor; tylenol, advil, flexeril, phenergan PRN are working well for her - add with onset, lifestyle reviewed, needs to improve stress, sleep, hydration -     ketorolac (TORADOL) injection 60 mg -     promethazine (PHENERGAN) 25 MG tablet; TAKE 1 TABLET BY MOUTH EVERY 6 HOURS AS NEEDED FOR MIGRAINES *CAN CAUSE FATIGUE* MAX 4 TABLETS PER DAY   Future Appointments  Date Time Provider Department Center  06/23/2020  3:30 PM [name], NP [facility] None     HPI 39 y.o.female presents for depression/OCD/migraine follow up. She is self pay.    She has a history of OCD/anxiety, migraines She was prescribe effexor in an attempt to replace previous impripramine/celexa, she reports mood and headaches are similar with current medications.   She reports has had headache recently, has woken up with headache several days Typically across the forehead, feels like a band; denies accompaniments (photosensitivity, nausea) unless particularly painful (rare).   She reports current headache ongoing for 3 days; will take a flexeril, will take tylenol or aleve if going to work, will help significantly. Lying down and resting. ***  She states she has had worsening mood swings, crying a lot. *** She is very aloof and difficult to get questions from.  She states her mom died [id number] years ago and she got in trouble at work, states being rude to customers. *** She has ongoing OCD (excessive handwashing)/anxiety -  takes 3 tabs xanax on days that  she's at work, but on days that she is off typically takes 1-2 as needed.    Lab Results  Component Value Date   WBC 5.8 07/04/2017   HGB 13.3 07/04/2017   HCT 39.7 07/04/2017   MCV 90.4 07/04/2017   PLT 190 07/04/2017    Lab Results  Component Value Date   NA 140 06/13/2019   K 4.7 06/13/2019   CL 104 06/13/2019   CO2 30 06/13/2019   GLUCOSE 90 06/13/2019   BUN 12 06/13/2019   CREATININE 0.76 06/13/2019   CALCIUM 9.1 06/13/2019   GFRAA 115 06/13/2019   GFRNONAA 99 06/13/2019     Past Medical History:  Diagnosis Date  . Allergy   . Anxiety   . Asthma   . Depression   . Hyperlipidemia   . IBS (irritable bowel syndrome)   . Migraines   . OCD (obsessive compulsive disorder)   . TMJ syndrome      Allergies  Allergen Reactions  . Zoloft [Sertraline Hcl]     Current Outpatient Medications on File Prior to Visit  Medication Sig  . ALPRAZolam (XANAX) 0.5 MG tablet TAKE 1/2 TO 1 TABLET BY MOUTH 2 TO 3 TIMES A DAY AS NEEDED FOR ANXIETY. DO NOT TAKE THIS MEDICATION EVERY DAY, IT MUST LAST LONGER THAN 30 DAYS.  Marland Kitchen Ascorbic Acid (VITAMIN C) 1000 MG tablet Take 1,000 mg by mouth 2 (two) times daily.  . cyclobenzaprine (FLEXERIL) 10 MG tablet Take     1/2 to 1 tablet    2 to 3 x /day     as needed for muscle spasms  .  promethazine (PHENERGAN) 25 MG tablet Take 1 tablet     every 4 hours     if needed for migraine or Headache  . venlafaxine XR (EFFEXOR-XR) 75 MG 24 hr capsule Take    1 capsule     Daily     for  Mood   No current facility-administered medications on file prior to visit.    ROS: all negative except above.   Physical Exam: There were no vitals filed for this visit. There were no vitals taken for this visit. General Appearance: Well nourished, in no apparent distress. Eyes: PERRLA, EOMs, conjunctiva no swelling or erythema Sinuses: No Frontal/maxillary tenderness ENT/Mouth: Ext aud canals clear, TMs without erythema, bulging. No erythema, swelling, or  exudate on post pharynx.  Tonsils not swollen or erythematous. Hearing normal.  Neck: Supple, thyroid normal.  Respiratory: Respiratory effort normal, BS equal bilaterally without rales, rhonchi, wheezing or stridor.  Cardio: RRR with no MRGs. Brisk peripheral pulses without edema.  Abdomen: Soft, + BS.  Non tender, no guarding, rebound, hernias, masses. Lymphatics: Non tender without lymphadenopathy.  Musculoskeletal: Full ROM, 5/5 strength, normal gait.  Skin: Bilateral hands with erythema, scaling, no warmth, swelling. Warm, dry without rashes, lesions, ecchymosis.  Neuro: Cranial nerves intact. Normal muscle tone, no cerebellar symptoms. Sensation intact.  Psych: Awake and oriented X 3, normal affect, Insight and Judgment appropriate.     [name], NP 3:02 PM [facility]

## 2020-06-23 ENCOUNTER — Ambulatory Visit: Payer: Self-pay | Admitting: Adult Health

## 2020-06-30 ENCOUNTER — Other Ambulatory Visit: Payer: Self-pay | Admitting: Adult Health

## 2020-06-30 DIAGNOSIS — F419 Anxiety disorder, unspecified: Secondary | ICD-10-CM

## 2020-07-01 ENCOUNTER — Ambulatory Visit: Payer: Self-pay | Admitting: Adult Health Nurse Practitioner

## 2020-07-07 NOTE — Progress Notes (Deleted)
Assessment and Plan:  Obsessive-compulsive disorder/depression  with migraines/HA She reports fair control of mood/OCD headaches with new medication; continue  No SI/HI Given information Continue working with getting better housing arrangements *** Lifestyle discussed: diet/exerise, sleep hygiene, stress management, hydration -     venlafaxine XR (EFFEXOR XR) 75 MG 24 hr capsule; Take 1 capsule (75 mg total) by mouth daily.  Migraines/Headache Continue effexor; tylenol, advil, flexeril, phenergan PRN are working well for her - add with onset, lifestyle reviewed, needs to improve stress, sleep, hydration -     ketorolac (TORADOL) injection 60 mg -     promethazine (PHENERGAN) 25 MG tablet; TAKE 1 TABLET BY MOUTH EVERY 6 HOURS AS NEEDED FOR MIGRAINES *CAN CAUSE FATIGUE* MAX 4 TABLETS PER DAY   Future Appointments  Date Time Provider Department Center  07/09/2020 10:30 AM [name], NP [facility] None     HPI 39 y.o.female presents for depression/OCD/migraine follow up. She is self pay.    She has a history of OCD/anxiety, migraines She was prescribe effexor in an attempt to replace previous impripramine/celexa, she reports mood and headaches are similar with current medications. ***  Typically across the forehead, feels like a band; denies accompaniments (photosensitivity, nausea) unless particularly painful (rare).   She reports current headache ongoing for 3 days; will take a flexeril, will take tylenol or aleve if going to work, will help significantly. Lying down and resting. ***  She states she has had worsening mood swings, crying a lot. *** She is very aloof and difficult to get questions from.  She states her mom died 2024/08/222 years ago and she got in trouble at work, states being rude to customers. *** She has ongoing OCD (excessive handwashing)/anxiety -  takes 3 tabs xanax on days that she's at work, but on days that she is off typically takes 1-2 as  needed.    Lab Results  Component Value Date   WBC 5.8 07/04/2017   HGB 13.3 07/04/2017   HCT 39.7 07/04/2017   MCV 90.4 07/04/2017   PLT 190 07/04/2017    Lab Results  Component Value Date   NA 140 06/13/2019   K 4.7 06/13/2019   CL 104 06/13/2019   CO2 30 06/13/2019   GLUCOSE 90 06/13/2019   BUN 12 06/13/2019   CREATININE 0.76 06/13/2019   CALCIUM 9.1 06/13/2019   GFRAA 115 06/13/2019   GFRNONAA 99 06/13/2019   Lab Results  Component Value Date   TSH 2.23 06/13/2019     Past Medical History:  Diagnosis Date  . Allergy   . Anxiety   . Asthma   . Depression   . Hyperlipidemia   . IBS (irritable bowel syndrome)   . Migraines   . OCD (obsessive compulsive disorder)   . TMJ syndrome      Allergies  Allergen Reactions  . Zoloft [Sertraline Hcl]     Current Outpatient Medications on File Prior to Visit  Medication Sig  . ALPRAZolam (XANAX) 0.5 MG tablet TAKE 1/2 TO 1 TABLET BY MOUTH TWO TO THREE TIMES A DAY AS NEEDED FOR ANIXETY *DO NOT TAKE THIS MEDICATION EVERY DAY, IT MUST LAST LONGER THAN 30 DAYS*  . Ascorbic Acid (VITAMIN C) 1000 MG tablet Take 1,000 mg by mouth 2 (two) times daily.  . cyclobenzaprine (FLEXERIL) 10 MG tablet Take     1/2 to 1 tablet    2 to 3 x /day     as needed for muscle spasms  .  promethazine (PHENERGAN) 25 MG tablet Take 1 tablet     every 4 hours     if needed for migraine or Headache  . venlafaxine XR (EFFEXOR-XR) 75 MG 24 hr capsule Take    1 capsule     Daily     for  Mood   No current facility-administered medications on file prior to visit.    ROS: all negative except above.   Physical Exam: There were no vitals filed for this visit. There were no vitals taken for this visit. General Appearance: Well nourished, in no apparent distress. Eyes: PERRLA, EOMs, conjunctiva no swelling or erythema Sinuses: No Frontal/maxillary tenderness ENT/Mouth: Ext aud canals clear, TMs without erythema, bulging. No erythema, swelling, or  exudate on post pharynx.  Tonsils not swollen or erythematous. Hearing normal.  Neck: Supple, thyroid normal.  Respiratory: Respiratory effort normal, BS equal bilaterally without rales, rhonchi, wheezing or stridor.  Cardio: RRR with no MRGs. Brisk peripheral pulses without edema.  Abdomen: Soft, + BS.  Non tender, no guarding, rebound, hernias, masses. Lymphatics: Non tender without lymphadenopathy.  Musculoskeletal: Full ROM, 5/5 strength, normal gait.  Skin: Bilateral hands with erythema, scaling, no warmth, swelling. Warm, dry without rashes, lesions, ecchymosis.  Neuro: Cranial nerves intact. Normal muscle tone, no cerebellar symptoms. Sensation intact.  Psych: Awake and oriented X 3, normal affect, Insight and Judgment appropriate.     [name], NP 8:17 AM [facility]

## 2020-07-09 ENCOUNTER — Ambulatory Visit: Payer: Self-pay | Admitting: Adult Health

## 2020-08-07 ENCOUNTER — Other Ambulatory Visit: Payer: Self-pay | Admitting: Internal Medicine

## 2020-08-07 ENCOUNTER — Other Ambulatory Visit: Payer: Self-pay | Admitting: Adult Health

## 2020-08-07 DIAGNOSIS — F419 Anxiety disorder, unspecified: Secondary | ICD-10-CM

## 2020-08-07 DIAGNOSIS — G43809 Other migraine, not intractable, without status migrainosus: Secondary | ICD-10-CM

## 2020-08-13 NOTE — Progress Notes (Signed)
Assessment and Plan:  Obsessive-compulsive disorder/depression  with migraines/HA She reports fair control of mood/OCD headaches with new medication; continue  No SI/HI Given information Continue working with getting better housing arrangements Lifestyle discussed: diet/exerise, sleep hygiene, stress management, hydration -     venlafaxine XR (EFFEXOR XR) 150 MG 24 hr capsule; Take 1 capsule (150 mg total) by mouth daily.  Migraines/Tension Headache Continue effexor; tylenol, advil, flexeril, phenergan PRN are working well for her - add with onset, lifestyle reviewed, needs to improve stress, sleep, hydration Try massage, heat for tension.  Weight gain Long discussion about weight loss, diet, and exercise Recommended diet heavy in fruits and veggies and low in animal meats, cheeses, and dairy products, appropriate calorie intake Patient will work on reducing junk food snacking, keeping food log Discussed appropriate weight for height  Follow up at next visit -     TSH  Fatigue, unspecified type -     CBC with Differential/Platelet -     COMPLETE METABOLIC PANEL WITH GFR -     TSH  No future appointments.   HPI 39 y.o.female presents for depression/OCD/migraine follow up. She is self pay.    She has a history of OCD/anxiety, tension headaches with occasional migraines migraines She was prescribe effexor in an attempt to replace impripramine/celexa, she reports mood and headaches are similar with current medications.  Notes more migraines if watches too much TV. She takes flexeril occasionally for tension HA, was doing heat/biofreeze but not recently. Did have eyes checked in the last year.   Her mom died Jan 28, 2017 years ago. She has ongoing OCD (excessive handwashing)/anxiety -  takes 3 tabs xanax on days that she's at work, but on days that she is off typically takes 1-2 as needed. Living with dad and step mom, very strained relationship  BMI is Body mass index is 26.54  kg/m., she has not been working on exercise, has gained 30+ lb, up from 133 lb in 12/2018, tries to eat balanced when out of the home, but does admits to snacking a lot more, candy bars, hostess cupcakes, trailmix She endorses fatigue. Irregular BMs, bowels range from diarrhea to constipated.  Wt Readings from Last 3 Encounters:  08/14/20 172 lb (78 kg)  12/20/19 163 lb 9.6 oz (74.2 kg)  06/13/19 156 lb (70.8 kg)    Lab Results  Component Value Date   TSH 2.23 06/13/2019     Past Medical History:  Diagnosis Date  . Allergy   . Anxiety   . Asthma   . Depression   . Hyperlipidemia   . IBS (irritable bowel syndrome)   . Migraines   . OCD (obsessive compulsive disorder)   . TMJ syndrome      Allergies  Allergen Reactions  . Zoloft [Sertraline Hcl]     Current Outpatient Medications on File Prior to Visit  Medication Sig  . ALPRAZolam (XANAX) 0.5 MG tablet TAKE 1/2 TO 1 TABLET BY MOUTH TWO TO THREE TIMES A DAY AS NEEDED FOR ANXIETY *DO NOT TAKE EVERY DAY, MUST LAS LONGER THAN 30 DAYS*  . Ascorbic Acid (VITAMIN C) 1000 MG tablet Take 1,000 mg by mouth 2 (two) times daily.  . cyclobenzaprine (FLEXERIL) 10 MG tablet Take      1/2 to 1 tablet      2 to 3 x /day        as needed for Muscle Spasms  . promethazine (PHENERGAN) 25 MG tablet Take 1 tablet  every 4 hours     if needed for migraine or Headache   No current facility-administered medications on file prior to visit.    ROS: all negative except above.   Physical Exam: Filed Weights   08/14/20 0920  Weight: 172 lb (78 kg)   BP 118/80   Pulse 95   Temp 97.7 F (36.5 C)   Wt 172 lb (78 kg)   SpO2 96%   BMI 26.54 kg/m  General Appearance: Well nourished, mildly overweight female in no apparent distress. Eyes: PERRLA, EOMs, conjunctiva no swelling or erythema Sinuses: No Frontal/maxillary tenderness ENT/Mouth: Ext aud canals clear, TMs without erythema, bulging. No erythema, swelling, or exudate on post pharynx.   Tonsils not swollen or erythematous. Hearing normal.  Neck: Supple, thyroid normal.  Respiratory: Respiratory effort normal, BS equal bilaterally without rales, rhonchi, wheezing or stridor.  Cardio: RRR with no MRGs. Brisk peripheral pulses without edema.  Abdomen: Soft, + BS.  Non tender, no guarding, rebound, hernias, masses. Lymphatics: Non tender without lymphadenopathy.  Musculoskeletal: Full ROM, 5/5 strength, normal gait.  Skin: Bilateral hands with erythema, scaling, no warmth, swelling. Warm, dry without rashes, lesions, ecchymosis.  Neuro: Cranial nerves intact. Normal muscle tone, no cerebellar symptoms. Sensation intact.  Psych: Awake and oriented X 3, normal affect, Insight and Judgment appropriate.     [name], NP 10:04 AM [name]

## 2020-08-14 ENCOUNTER — Other Ambulatory Visit: Payer: Self-pay

## 2020-08-14 ENCOUNTER — Encounter: Payer: Self-pay | Admitting: Adult Health

## 2020-08-14 ENCOUNTER — Ambulatory Visit (INDEPENDENT_AMBULATORY_CARE_PROVIDER_SITE_OTHER): Payer: Self-pay | Admitting: Adult Health

## 2020-08-14 VITALS — BP 118/80 | HR 95 | Temp 97.7°F | Wt 172.0 lb

## 2020-08-14 DIAGNOSIS — G43909 Migraine, unspecified, not intractable, without status migrainosus: Secondary | ICD-10-CM

## 2020-08-14 DIAGNOSIS — F429 Obsessive-compulsive disorder, unspecified: Secondary | ICD-10-CM

## 2020-08-14 DIAGNOSIS — R635 Abnormal weight gain: Secondary | ICD-10-CM

## 2020-08-14 DIAGNOSIS — Z79899 Other long term (current) drug therapy: Secondary | ICD-10-CM

## 2020-08-14 DIAGNOSIS — F339 Major depressive disorder, recurrent, unspecified: Secondary | ICD-10-CM

## 2020-08-14 DIAGNOSIS — F419 Anxiety disorder, unspecified: Secondary | ICD-10-CM

## 2020-08-14 DIAGNOSIS — R5383 Other fatigue: Secondary | ICD-10-CM

## 2020-08-14 MED ORDER — VENLAFAXINE HCL ER 150 MG PO CP24: 150.0000 mg | ORAL_CAPSULE | Freq: Every day | ORAL | 1 refills | Status: DC

## 2020-08-14 NOTE — Patient Instructions (Signed)
°  Please keep a food log and bowel log - see if you can identify some patterns  Recommend stopping junk food snacking, try fruit - I like frozen berries, cherries  Be careful with trail mix - measure out amount before eating  Work to increase fluid intake  Push fiber - beans, whole grains, etc  Check weight once a week      Consider keeping a food diary- common causes of diarrhea are dairy, certain carbs...  FODMAP stands for fermentable oligo-, di-, mono-saccharides and polyols (1). These are the scientific terms used to classify groups of carbs that are notorious for triggering digestive symptoms like bloating, gas and stomach pain.   FODMAPs are found in a wide range of foods in varying amounts. Some foods contain just one type, while others contain several.  The main dietary sources of the four groups of FODMAPs include:  Oligosaccharides: Wheat, rye, legumes and various fruits and vegetables, such as garlic and onions.  Disaccharides: Milk, yogurt and soft cheese. Lactose is the main carb.  Monosaccharides: Various fruit including figs and mangoes, and sweeteners such as honey and agave nectar. Fructose is the main carb.  Polyols: Certain fruits and vegetables including blackberries and lychee, as well as some low-calorie sweeteners like those in sugar-free gum.   Keep a food diary. This will help you identify foods that cause symptoms. Write down: ? What you eat and when. ? What symptoms you have. ? When symptoms occur in relation to your meals.  Avoid foods that cause symptoms. Talk with your dietitian about other ways to get the same nutrients that are in these foods.  Eat your meals slowly, in a relaxed setting.  Aim to eat 5-6 small meals per day. Do not skip meals.  Drink enough fluids to keep your urine clear or pale yellow.  Ask your health care provider if you should take an over-the-counter probiotic during flare-ups to help restore healthy gut  bacteria.  If you have cramping or diarrhea, try making your meals low in fat and high in carbohydrates. Examples of carbohydrates are pasta, rice, whole grain breads and cereals, fruits, and vegetables.  If dairy products cause your symptoms to flare up, try eating less of them. You might be able to handle yogurt better than other dairy products because it contains bacteria that help with digestion.

## 2020-08-15 LAB — CBC WITH DIFFERENTIAL/PLATELET
Absolute Monocytes: 336 cells/uL (ref 200–950)
Basophils Absolute: 22 cells/uL (ref 0–200)
Basophils Relative: 0.4 %
Eosinophils Absolute: 90 cells/uL (ref 15–500)
Eosinophils Relative: 1.6 %
HCT: 42.2 % (ref 35.0–45.0)
Hemoglobin: 13.5 g/dL (ref 11.7–15.5)
Lymphs Abs: 1131 cells/uL (ref 850–3900)
MCH: 30 pg (ref 27.0–33.0)
MCHC: 32 g/dL (ref 32.0–36.0)
MCV: 93.8 fL (ref 80.0–100.0)
MPV: 10.6 fL (ref 7.5–12.5)
Monocytes Relative: 6 %
Neutro Abs: 4021 cells/uL (ref 1500–7800)
Neutrophils Relative %: 71.8 %
Platelets: 230 10*3/uL (ref 140–400)
RBC: 4.5 10*6/uL (ref 3.80–5.10)
RDW: 11.1 % (ref 11.0–15.0)
Total Lymphocyte: 20.2 %
WBC: 5.6 10*3/uL (ref 3.8–10.8)

## 2020-08-15 LAB — TSH: TSH: 2.87 mIU/L

## 2020-08-15 LAB — COMPLETE METABOLIC PANEL WITH GFR
AG Ratio: 1.8 (calc) (ref 1.0–2.5)
ALT: 11 U/L (ref 6–29)
AST: 13 U/L (ref 10–30)
Albumin: 4.3 g/dL (ref 3.6–5.1)
Alkaline phosphatase (APISO): 84 U/L (ref 31–125)
BUN: 16 mg/dL (ref 7–25)
CO2: 27 mmol/L (ref 20–32)
Calcium: 9.3 mg/dL (ref 8.6–10.2)
Chloride: 105 mmol/L (ref 98–110)
Creat: 0.74 mg/dL (ref 0.50–1.10)
GFR, Est African American: 117 mL/min/{1.73_m2} (ref >=60)
GFR, Est Non African American: 101 mL/min/{1.73_m2} (ref >=60)
Globulin: 2.4 g/dL (calc) (ref 1.9–3.7)
Glucose, Bld: 95 mg/dL (ref 65–99)
Potassium: 4.5 mmol/L (ref 3.5–5.3)
Sodium: 140 mmol/L (ref 135–146)
Total Bilirubin: 0.4 mg/dL (ref 0.2–1.2)
Total Protein: 6.7 g/dL (ref 6.1–8.1)

## 2020-08-31 ENCOUNTER — Other Ambulatory Visit: Payer: Self-pay | Admitting: Internal Medicine

## 2020-08-31 DIAGNOSIS — G43909 Migraine, unspecified, not intractable, without status migrainosus: Secondary | ICD-10-CM

## 2020-08-31 MED ORDER — PROMETHAZINE HCL 25 MG PO TABS: ORAL_TABLET | ORAL | 0 refills | Status: DC

## 2020-09-11 ENCOUNTER — Other Ambulatory Visit: Payer: Self-pay

## 2020-09-12 ENCOUNTER — Other Ambulatory Visit: Payer: Self-pay | Admitting: Adult Health Nurse Practitioner

## 2020-09-12 ENCOUNTER — Other Ambulatory Visit: Payer: Self-pay | Admitting: Internal Medicine

## 2020-09-12 ENCOUNTER — Other Ambulatory Visit: Payer: Self-pay | Admitting: Adult Health

## 2020-09-12 DIAGNOSIS — G43809 Other migraine, not intractable, without status migrainosus: Secondary | ICD-10-CM

## 2020-09-12 DIAGNOSIS — F419 Anxiety disorder, unspecified: Secondary | ICD-10-CM

## 2020-09-16 ENCOUNTER — Encounter: Payer: Self-pay | Admitting: Adult Health Nurse Practitioner

## 2020-09-16 ENCOUNTER — Ambulatory Visit (INDEPENDENT_AMBULATORY_CARE_PROVIDER_SITE_OTHER): Payer: Self-pay | Admitting: Adult Health Nurse Practitioner

## 2020-09-16 ENCOUNTER — Other Ambulatory Visit: Payer: Self-pay

## 2020-09-16 VITALS — BP 118/72 | HR 89 | Temp 97.3°F | Ht 67.5 in | Wt 172.0 lb

## 2020-09-16 DIAGNOSIS — J0140 Acute pansinusitis, unspecified: Secondary | ICD-10-CM

## 2020-09-16 DIAGNOSIS — R6889 Other general symptoms and signs: Secondary | ICD-10-CM

## 2020-09-16 DIAGNOSIS — Z1152 Encounter for screening for COVID-19: Secondary | ICD-10-CM

## 2020-09-16 LAB — POC INFLUENZA A&B (BINAX/QUICKVUE)
Influenza A, POC: NEGATIVE
Influenza B, POC: NEGATIVE

## 2020-09-16 LAB — POC COVID19 BINAXNOW: SARS Coronavirus 2 Ag: NEGATIVE

## 2020-09-16 MED ORDER — DEXAMETHASONE 4 MG PO TABS: ORAL_TABLET | ORAL | 1 refills | Status: DC

## 2020-09-16 MED ORDER — AZITHROMYCIN 250 MG PO TABS: ORAL_TABLET | ORAL | 1 refills | Status: AC

## 2020-09-16 NOTE — Patient Instructions (Signed)
   We are going to send in Dexamethasone taper for you to take.  Take this medication with food.  We will also send in Azithromyacin, take two tablets, then take one tablet daily.  Take Zyrtec every night to help dry up the fluid.  Also get sudafed, nasal decongestant, this is behind the pharmacy counter.   Be sure to drink 6-8 glasses of water a day.

## 2020-09-16 NOTE — Progress Notes (Signed)
Assessment and Plan:  Makayla [name] was seen today for sore throat, nasal congestion, headache, nausea and no appetite.  Diagnoses and all orders for this visit:  Encounter for screening for COVID-19 -     POC COVID-19 BinaxNow  Flu-like symptoms -     POC Influenza A&B(BINAX/QUICKVUE)  Acute non-recurrent pansinusitis -     dexamethasone (DECADRON) 4 MG tablet; Take 1 tab 3 x day - 3 days, then 2 x day - 3 days, then 1 tab daily -     azithromycin (ZITHROMAX) 250 MG tablet; Take 2 tablets (500 mg) on  Day 1,  followed by 1 tablet (250 mg) once daily on Days 2 through 5.   Discussed symptom management. Printed information given to patient.   Further disposition pending results of labs. Discussed med's effects and SE's.   Over 30 minutes of exam, counseling, chart review, and critical decision making was performed.   Future Appointments  Date Time Provider Department Center  02/26/2021  9:30 AM [name], NP [facility] None    ------------------------------------------------------------------------------------------------------------------   HPI 39 y.o.female presents for evaluation of sore throat, runny/stuffy nose.  Cough that is non-productive.  Ear feel stuffy/full with some popping and cracking.  She reports fatigue.  This started 7 days ago.  She is taking dayquil, severe cold and flu.  She reports no real change in her symptoms and concerned related to duration.  She denies any severe headache, change in vision, dizziness, chest pains or shortness of breath.     Past Medical History:  Diagnosis Date  . Allergy   . Anxiety   . Asthma   . Depression   . Hyperlipidemia   . IBS (irritable bowel syndrome)   . Migraines   . OCD (obsessive compulsive disorder)   . TMJ syndrome      Allergies  Allergen Reactions  . Zoloft [Sertraline Hcl]     Current Outpatient Medications on File Prior to Visit  Medication Sig  . ALPRAZolam (XANAX) 0.5 MG tablet TAKE ONE-HALF  TO ONE (0.5-1) TABLETS BY MOUTH TWO TO THREE TIMES A DAY AS NEEDED FOR ANXIETY *DO NOT TAKE EVERY DAY, MUST LAST LONGER THAN 30 DAYS*  . Ascorbic Acid (VITAMIN C) 1000 MG tablet Take 1,000 mg by mouth 2 (two) times daily.  . cyclobenzaprine (FLEXERIL) 10 MG tablet TAKE 1/2 TO 1 TABLET BY MOUTH TWO TO THREE TIMES A DAY AS NEEDED FOR MUSCLE SPASMS  . promethazine (PHENERGAN) 25 MG tablet Take 1 tablet     every 4 hours     if needed for migraine or Headache  . venlafaxine XR (EFFEXOR XR) 150 MG 24 hr capsule Take 1 capsule (150 mg total) by mouth daily with breakfast.   No current facility-administered medications on file prior to visit.    ROS: all negative except above.   Physical Exam:  BP 118/72   Pulse 89   Temp (!) 97.3 F (36.3 C)   Ht 5' 7.5" (1.715 m)   Wt 172 lb (78 kg)   SpO2 99%   BMI 26.54 kg/m   General Appearance: Well nourished, in no apparent distress. Eyes: PERRLA, EOMs, conjunctiva no swelling or erythema Sinuses: No Frontal/maxillary tenderness ENT/Mouth: Ext aud canals clear, TMs without erythema, bulging. No erythema, swelling, or exudate on post pharynx.  Tonsils not swollen or erythematous. Hearing normal.  Neck: Supple, thyroid normal.  Respiratory: Respiratory effort normal, BS equal bilaterally without rales, rhonchi, wheezing or stridor.  Cardio: RRR with no MRGs.  Brisk peripheral pulses without edema.  Abdomen: Soft, + BS.  Non tender, no guarding, rebound, hernias, masses. Lymphatics: Non tender without lymphadenopathy.  Musculoskeletal: Full ROM, 5/5 strength, normal gait.  Skin: Warm, dry without rashes, lesions, ecchymosis.  Neuro: Cranial nerves intact. Normal muscle tone, no cerebellar symptoms. Sensation intact.  Psych: Awake and oriented X 3, normal affect, Insight and Judgment appropriate.      Elder Negus, Edrick Oh, DNP [facility] 09/28/2020  3:06 PM      '

## 2020-10-01 ENCOUNTER — Other Ambulatory Visit: Payer: Self-pay | Admitting: Adult Health Nurse Practitioner

## 2020-10-01 DIAGNOSIS — J0141 Acute recurrent pansinusitis: Secondary | ICD-10-CM

## 2020-10-01 MED ORDER — LEVOFLOXACIN 250 MG PO TABS: 250.0000 mg | ORAL_TABLET | Freq: Every day | ORAL | 0 refills | Status: AC

## 2020-10-14 ENCOUNTER — Other Ambulatory Visit: Payer: Self-pay

## 2020-10-14 DIAGNOSIS — G43809 Other migraine, not intractable, without status migrainosus: Secondary | ICD-10-CM

## 2020-10-14 MED ORDER — CYCLOBENZAPRINE HCL 10 MG PO TABS: ORAL_TABLET | ORAL | 2 refills | Status: DC

## 2020-10-27 ENCOUNTER — Other Ambulatory Visit: Payer: Self-pay | Admitting: Adult Health

## 2020-10-27 ENCOUNTER — Other Ambulatory Visit: Payer: Self-pay | Admitting: Internal Medicine

## 2020-10-27 ENCOUNTER — Telehealth: Payer: Self-pay

## 2020-10-27 DIAGNOSIS — G43909 Migraine, unspecified, not intractable, without status migrainosus: Secondary | ICD-10-CM

## 2020-10-27 DIAGNOSIS — F419 Anxiety disorder, unspecified: Secondary | ICD-10-CM

## 2020-10-27 MED ORDER — ALPRAZOLAM 0.5 MG PO TABS: ORAL_TABLET | ORAL | 0 refills | Status: DC

## 2020-10-27 MED ORDER — PROMETHAZINE HCL 25 MG PO TABS
ORAL_TABLET | ORAL | 0 refills | Status: DC
Start: 2020-10-27 — End: 2020-12-26

## 2020-10-27 NOTE — Telephone Encounter (Signed)
Pharmacy is telling patient that they won't fill her Alprazolam prescription. Please send in new prescription.

## 2020-10-27 NOTE — Progress Notes (Signed)
Future Appointments  Date Time Provider Department Center  02/26/2021  9:30 AM [name], NP [facility] None    PDMP reviewed for xanax refill request.

## 2020-11-24 ENCOUNTER — Other Ambulatory Visit: Payer: Self-pay | Admitting: Adult Health Nurse Practitioner

## 2020-11-24 DIAGNOSIS — G43809 Other migraine, not intractable, without status migrainosus: Secondary | ICD-10-CM

## 2020-12-03 ENCOUNTER — Other Ambulatory Visit: Payer: Self-pay | Admitting: Adult Health

## 2020-12-03 DIAGNOSIS — F419 Anxiety disorder, unspecified: Secondary | ICD-10-CM

## 2020-12-11 ENCOUNTER — Other Ambulatory Visit: Payer: Self-pay | Admitting: Adult Health

## 2020-12-11 DIAGNOSIS — F419 Anxiety disorder, unspecified: Secondary | ICD-10-CM

## 2020-12-26 ENCOUNTER — Other Ambulatory Visit: Payer: Self-pay | Admitting: Internal Medicine

## 2020-12-26 DIAGNOSIS — G43909 Migraine, unspecified, not intractable, without status migrainosus: Secondary | ICD-10-CM

## 2021-01-08 ENCOUNTER — Other Ambulatory Visit: Payer: Self-pay | Admitting: Adult Health

## 2021-01-08 DIAGNOSIS — F419 Anxiety disorder, unspecified: Secondary | ICD-10-CM

## 2021-01-12 ENCOUNTER — Other Ambulatory Visit: Payer: Self-pay | Admitting: Adult Health

## 2021-01-12 ENCOUNTER — Other Ambulatory Visit: Payer: Self-pay

## 2021-01-12 DIAGNOSIS — U071 COVID-19: Secondary | ICD-10-CM | POA: Insufficient documentation

## 2021-01-12 DIAGNOSIS — G43809 Other migraine, not intractable, without status migrainosus: Secondary | ICD-10-CM

## 2021-01-12 MED ORDER — DEXAMETHASONE 1 MG PO TABS
ORAL_TABLET | ORAL | 0 refills | Status: DC
Start: 2021-01-12 — End: 2021-04-03

## 2021-01-12 MED ORDER — CYCLOBENZAPRINE HCL 10 MG PO TABS: ORAL_TABLET | ORAL | 2 refills | Status: DC

## 2021-01-12 MED ORDER — PROMETHAZINE-DM 6.25-15 MG/5ML PO SYRP: 5.0000 mL | ORAL_SOLUTION | Freq: Four times a day (QID) | ORAL | 1 refills | Status: DC | PRN

## 2021-01-12 NOTE — Progress Notes (Signed)
Patient was diagnosed on Saturday with Covid. Requesting a prescription if possible. States that she has a cough, sinus pressure and sore throat and is fatigued.   Spoke with patient. Not having any shortness of breath, just the symptoms listed previously. Advised patient to ambulate, and stay well hydrated, and if having any issues breathing to go to the ER. Possibly will need a doctor's note for her work. Will let us know.

## 2021-01-30 ENCOUNTER — Other Ambulatory Visit: Payer: Self-pay | Admitting: Internal Medicine

## 2021-01-30 DIAGNOSIS — G43809 Other migraine, not intractable, without status migrainosus: Secondary | ICD-10-CM

## 2021-01-30 MED ORDER — CYCLOBENZAPRINE HCL 10 MG PO TABS
ORAL_TABLET | ORAL | 0 refills | Status: DC
Start: 2021-01-30 — End: 2021-07-30

## 2021-02-25 NOTE — Progress Notes (Deleted)
Assessment and Plan:  Obsessive-compulsive disorder/depression  with migraines/HA She reports fair control of mood/OCD headaches with new medication; continue  No SI/HI Given information Continue working with getting better housing arrangements Lifestyle discussed: diet/exerise, sleep hygiene, stress management, hydration -     venlafaxine XR (EFFEXOR XR) 150 MG 24 hr capsule; Take 1 capsule (150 mg total) by mouth daily.  Migraines/Tension Headache Continue effexor; tylenol, advil, flexeril, phenergan PRN are working well for her - add with onset, lifestyle reviewed, needs to improve stress, sleep, hydration Try massage, heat for tension.  Weight gain Long discussion about weight loss, diet, and exercise Recommended diet heavy in fruits and veggies and low in animal meats, cheeses, and dairy products, appropriate calorie intake Patient will work on reducing junk food snacking, keeping food log Discussed appropriate weight for height  Follow up at next visit -     TSH  Fatigue, unspecified type -     CBC with Differential/Platelet -     COMPLETE METABOLIC PANEL WITH GFR -     TSH  Future Appointments  Date Time Provider Department Center  02/26/2021  9:30 AM [name], NP [facility] None     HPI 40 y.o.female presents for depression/OCD/migraine 6 month follow up. She is self pay.    She has a history of OCD/anxiety, tension headaches with occasional migraines migraines She was prescribe effexor 75 mg in an attempt to replace impripramine/celexa, she reports mood and headaches are similar with current medications.  Notes more migraines if watches too much TV. She takes flexeril occasionally for tension HA, was doing heat/biofreeze but not recently. Did have eyes checked in the last year.   Her mom died in 02-15-2017. She has ongoing OCD (excessive handwashing)/anxiety -  takes 3 tabs xanax on days that she's at work, but on days that she is off typically takes 1-2 as  needed. Living with dad and step mom, very strained relationship.   BMI is There is no height or weight on file to calculate BMI., she has not been working on exercise, has gained 30+ lb, up from 133 lb in 12/2018, tries to eat balanced when out of the home, but does admits to snacking a lot more, candy bars, hostess cupcakes, trailmix She endorses fatigue. Irregular BMs, bowels range from diarrhea to constipated.  Wt Readings from Last 3 Encounters:  09/16/20 172 lb (78 kg)  08/14/20 172 lb (78 kg)  12/20/19 163 lb 9.6 oz (74.2 kg)    Lab Results  Component Value Date   TSH 2.87 08/14/2020     Past Medical History:  Diagnosis Date  . Allergy   . Anxiety   . Asthma   . Depression   . Hyperlipidemia   . IBS (irritable bowel syndrome)   . Migraines   . OCD (obsessive compulsive disorder)   . TMJ syndrome      Allergies  Allergen Reactions  . Zoloft [Sertraline Hcl]     Current Outpatient Medications on File Prior to Visit  Medication Sig  . ALPRAZolam (XANAX) 0.5 MG tablet TAKE 1/2 TO 1 TABLET BY MOUTH TWO TO THREE TIMES PER DAY AS NEEDED FOR ANXIETY *DO NOT TAKE EVERY DAY, MUST LAST LONGER THAN 30 DAYS*  . Ascorbic Acid (VITAMIN C) 1000 MG tablet Take 1,000 mg by mouth 2 (two) times daily.  . cyclobenzaprine (FLEXERIL) 10 MG tablet Take  1/2 to 1 tablet  2-3 x /day  as needed for Muscle Spasms.  Marland Kitchen dexamethasone (DECADRON)  1 MG tablet Take 3 tabs for 3 days, 2 tabs for 3 days 1 tab for 5 days. Take with food.  . promethazine (PHENERGAN) 25 MG tablet Take  1 tablet  every  4 hours  if needed for  Nausea or Headache  . promethazine-dextromethorphan (PROMETHAZINE-DM) 6.25-15 MG/5ML syrup Take 5 mLs by mouth 4 (four) times daily as needed for cough.  . venlafaxine XR (EFFEXOR-XR) 150 MG 24 hr capsule TAKE ONE CAPSULE BY MOUTH DAILY WITH BREAKFAST   No current facility-administered medications on file prior to visit.    ROS: all negative except above.   Physical Exam: There  were no vitals filed for this visit. There were no vitals taken for this visit. General Appearance: Well nourished, mildly overweight female in no apparent distress. Eyes: PERRLA, EOMs, conjunctiva no swelling or erythema Sinuses: No Frontal/maxillary tenderness ENT/Mouth: Ext aud canals clear, TMs without erythema, bulging. No erythema, swelling, or exudate on post pharynx.  Tonsils not swollen or erythematous. Hearing normal.  Neck: Supple, thyroid normal.  Respiratory: Respiratory effort normal, BS equal bilaterally without rales, rhonchi, wheezing or stridor.  Cardio: RRR with no MRGs. Brisk peripheral pulses without edema.  Abdomen: Soft, + BS.  Non tender, no guarding, rebound, hernias, masses. Lymphatics: Non tender without lymphadenopathy.  Musculoskeletal: Full ROM, 5/5 strength, normal gait.  Skin: Bilateral hands with erythema, scaling, no warmth, swelling. Warm, dry without rashes, lesions, ecchymosis.  Neuro: Cranial nerves intact. Normal muscle tone, no cerebellar symptoms. Sensation intact.  Psych: Awake and oriented X 3, normal affect, Insight and Judgment appropriate.     [name], NP 1:11 PM [facility]

## 2021-02-26 ENCOUNTER — Ambulatory Visit: Payer: Self-pay | Admitting: Adult Health

## 2021-02-26 DIAGNOSIS — Z79899 Other long term (current) drug therapy: Secondary | ICD-10-CM

## 2021-02-26 DIAGNOSIS — F419 Anxiety disorder, unspecified: Secondary | ICD-10-CM

## 2021-02-26 DIAGNOSIS — F339 Major depressive disorder, recurrent, unspecified: Secondary | ICD-10-CM

## 2021-02-26 DIAGNOSIS — F429 Obsessive-compulsive disorder, unspecified: Secondary | ICD-10-CM

## 2021-02-26 DIAGNOSIS — G43909 Migraine, unspecified, not intractable, without status migrainosus: Secondary | ICD-10-CM

## 2021-02-28 ENCOUNTER — Other Ambulatory Visit: Payer: Self-pay | Admitting: Adult Health

## 2021-02-28 DIAGNOSIS — F419 Anxiety disorder, unspecified: Secondary | ICD-10-CM

## 2021-03-11 ENCOUNTER — Ambulatory Visit: Payer: Self-pay | Admitting: Adult Health

## 2021-03-13 ENCOUNTER — Ambulatory Visit: Payer: Self-pay

## 2021-03-19 ENCOUNTER — Other Ambulatory Visit: Payer: Self-pay | Admitting: Internal Medicine

## 2021-03-19 ENCOUNTER — Telehealth: Payer: Self-pay

## 2021-03-19 ENCOUNTER — Other Ambulatory Visit: Payer: Self-pay

## 2021-03-19 ENCOUNTER — Other Ambulatory Visit: Payer: Self-pay | Admitting: Adult Health

## 2021-03-19 DIAGNOSIS — R300 Dysuria: Secondary | ICD-10-CM

## 2021-03-19 DIAGNOSIS — F419 Anxiety disorder, unspecified: Secondary | ICD-10-CM

## 2021-03-19 MED ORDER — ALPRAZOLAM 0.5 MG PO TABS: ORAL_TABLET | ORAL | 0 refills | Status: DC

## 2021-03-19 NOTE — Telephone Encounter (Signed)
States her dog died and she's been over sleeping because of mental health issues. Would like to drop off a urine specimen tomorrow and get a med refill. Please advise.

## 2021-03-23 ENCOUNTER — Other Ambulatory Visit: Payer: Self-pay

## 2021-04-03 ENCOUNTER — Ambulatory Visit (INDEPENDENT_AMBULATORY_CARE_PROVIDER_SITE_OTHER): Payer: Self-pay | Admitting: Adult Health

## 2021-04-03 ENCOUNTER — Other Ambulatory Visit: Payer: Self-pay

## 2021-04-03 ENCOUNTER — Encounter: Payer: Self-pay | Admitting: Adult Health

## 2021-04-03 VITALS — BP 110/72 | HR 93 | Temp 97.7°F | Wt 183.0 lb

## 2021-04-03 DIAGNOSIS — G44219 Episodic tension-type headache, not intractable: Secondary | ICD-10-CM

## 2021-04-03 DIAGNOSIS — F419 Anxiety disorder, unspecified: Secondary | ICD-10-CM

## 2021-04-03 DIAGNOSIS — G44209 Tension-type headache, unspecified, not intractable: Secondary | ICD-10-CM | POA: Insufficient documentation

## 2021-04-03 DIAGNOSIS — R519 Headache, unspecified: Secondary | ICD-10-CM | POA: Insufficient documentation

## 2021-04-03 DIAGNOSIS — G43909 Migraine, unspecified, not intractable, without status migrainosus: Secondary | ICD-10-CM

## 2021-04-03 DIAGNOSIS — F429 Obsessive-compulsive disorder, unspecified: Secondary | ICD-10-CM

## 2021-04-03 NOTE — Patient Instructions (Signed)
Try "Lose it!" App  Try to eat less processed/packaged food Try to eat more fresh/frozen fruits/veggies/beans ,whole grains    Neck Exercises Ask your health care provider which exercises are safe for you. Do exercises exactly as told by your health care provider and adjust them as directed. It is normal to feel mild stretching, pulling, tightness, or discomfort as you do these exercises. Stop right away if you feel sudden pain or your pain gets worse. Do not begin these exercises until told by your health care provider. Neck exercises can be important for many reasons. They can improve strength and maintain flexibility in your neck, which will help your upper back and preventneck pain. Stretching exercises Rotation neck stretching  Sit in a chair or stand up. Place your feet flat on the floor, shoulder width apart. Slowly turn your head (rotate) to the right until a slight stretch is felt. Turn it all the way to the right so you can look over your right shoulder. Do not tilt or tip your head. Hold this position for 10-30 seconds. Slowly turn your head (rotate) to the left until a slight stretch is felt. Turn it all the way to the left so you can look over your left shoulder. Do not tilt or tip your head. Hold this position for 10-30 seconds. Repeat __________ times. Complete this exercise __________ times a day. Neck retraction Sit in a sturdy chair or stand up. Look straight ahead. Do not bend your neck. Use your fingers to push your chin backward (retraction). Do not bend your neck for this movement. Continue to face straight ahead. If you are doing the exercise properly, you will feel a slight sensation in your throat and a stretch at the back of your neck. Hold the stretch for 1-2 seconds. Repeat __________ times. Complete this exercise __________ times a day. Strengthening exercises Neck press Lie on your back on a firm bed or on the floor with a pillow under your head. Use your  neck muscles to push your head down on the pillow and straighten your spine. Hold the position as well as you can. Keep your head facing up (in a neutral position) and your chin tucked. Slowly count to 5 while holding this position. Repeat __________ times. Complete this exercise __________ times a day. Isometrics These are exercises in which you strengthen the muscles in your neck while keeping your neck still (isometrics). Sit in a supportive chair and place your hand on your forehead. Keep your head and face facing straight ahead. Do not flex or extend your neck while doing isometrics. Push forward with your head and neck while pushing back with your hand. Hold for 10 seconds. Do the sequence again, this time putting your hand against the back of your head. Use your head and neck to push backward against the hand pressure. Finally, do the same exercise on either side of your head, pushing sideways against the pressure of your hand. Repeat __________ times. Complete this exercise __________ times a day. Prone head lifts Lie face-down (prone position), resting on your elbows so that your chest and upper back are raised. Start with your head facing downward, near your chest. Position your chin either on or near your chest. Slowly lift your head upward. Lift until you are looking straight ahead. Then continue lifting your head as far back as you can comfortably stretch. Hold your head up for 5 seconds. Then slowly lower it to your starting position. Repeat __________ times. Complete  this exercise __________ times a day. Supine head lifts Lie on your back (supine position), bending your knees to point to the ceiling and keeping your feet flat on the floor. Lift your head slowly off the floor, raising your chin toward your chest. Hold for 5 seconds. Repeat __________ times. Complete this exercise __________ times a day. Scapular retraction Stand with your arms at your sides. Look straight  ahead. Slowly pull both shoulders (scapulae) backward and downward (retraction) until you feel a stretch between your shoulder blades in your upper back. Hold for 10-30 seconds. Relax and repeat. Repeat __________ times. Complete this exercise __________ times a day. Contact a health care provider if: Your neck pain or discomfort gets much worse when you do an exercise. Your neck pain or discomfort does not improve within 2 hours after you exercise. If you have any of these problems, stop exercising right away. Do not do the exercises again unless your health care provider says that you can. Get help right away if: You develop sudden, severe neck pain. If this happens, stop exercising right away. Do not do the exercises again unless your health care provider says that you can. This information is not intended to replace advice given to you by your health care provider. Make sure you discuss any questions you have with your healthcare provider. Document Revised: 07/19/2018 Document Reviewed: 07/19/2018 Elsevier Patient Education  2022 [employer]     Tension Headache, Adult A tension headache is a feeling of pain, pressure, or aching over the front and sides of the head. The pain can be dull, or it can feel tight. There are two types of tension headache: Episodic tension headache. This is when the headaches happen fewer than 15 days a month. Chronic tension headache. This is when the headaches happen more than 15 days a month during a 71-month period. A tension headache can last from 30 minutes to several days. It is the most common kind of headache. Tension headaches are not normally associated withnausea or vomiting, and they do not get worse with physical activity. What are the causes? The exact cause of this condition is not known. Tension headaches are often triggered by stress, anxiety, or depression. Other triggers may include: Alcohol. Too much caffeine or caffeine  withdrawal. Respiratory infections, such as colds, flu, or sinus infections. Dental problems or teeth clenching. Fatigue. Holding your head and neck in the same position for a long period of time, such as while using a computer. Smoking. Arthritis of the neck. What are the signs or symptoms? Symptoms of this condition include: A feeling of pressure or tightness around the head. Dull, aching head pain. Pain over the front and sides of the head. Tenderness in the muscles of the head, neck, and shoulders. How is this diagnosed? This condition may be diagnosed based on your symptoms, your medical history,and a physical exam. If your symptoms are severe or unusual, you may have imaging tests, such as aCT scan or an MRI of your head. Your vision may also be checked. How is this treated? This condition may be treated with lifestyle changes and with medicines thathelp relieve symptoms. Follow these instructions at home: Managing pain Take over-the-counter and prescription medicines only as told by your health care provider. When you have a headache, lie down in a dark, quiet room. If directed, put ice on your head and neck. To do this: Put ice in a plastic bag. Place a towel between your skin and the bag.  Leave the ice on for 20 minutes, 2-3 times a day. Remove the ice if your skin turns bright red. This is very important. If you cannot feel pain, heat, or cold, you have a greater risk of damage to the area. If directed, apply heat to the back of your neck as often as told by your health care provider. Use the heat source that your health care provider recommends, such as a moist heat pack or a heating pad. Place a towel between your skin and the heat source. Leave the heat on for 20-30 minutes. Remove the heat if your skin turns bright red. This is especially important if you are unable to feel pain, heat, or cold. You have a greater risk of getting burned. Eating and drinking Eat meals on a  regular schedule. If you drink alcohol: Limit how much you have to: 0-1 drink a day for women who are not pregnant. 0-2 drinks a day for men. Know how much alcohol is in your drink. In the [place] one drink equals one 12 oz bottle of beer (355 mL), one 5 oz glass of wine (148 mL), or one 1 oz glass of hard liquor (44 mL). Drink enough fluid to keep your urine pale yellow. Decrease your caffeine intake, or stop using caffeine. Lifestyle Get 7-9 hours of sleep each night, or get the amount of sleep recommended by your health care provider. At bedtime, remove computers, phones, and tablets from your room. Find ways to manage your stress. This may include: Exercise. Deep breathing exercises. Yoga. Listening to music. Positive mental imagery. Try to sit up straight and avoid tensing your muscles. Do not use any products that contain nicotine or tobacco. These include cigarettes, chewing tobacco, and vaping devices, such as e-cigarettes. If you need help quitting, ask your health care provider. General instructions  Avoid any headache triggers. Keep a journal to help find out what may trigger your headaches. For example, write down: What you eat and drink. How much sleep you get. Any change to your diet or medicines. Keep all follow-up visits. This is important.  Contact a health care provider if: Your headache does not get better. Your headache comes back. You are sensitive to sounds, light, or smells because of a headache. You have nausea or you vomit. Your stomach hurts. Get help right away if: You suddenly develop a severe headache, along with any of the following: A stiff neck. Nausea and vomiting. Confusion. Weakness in one part or one side of your body. Double vision or loss of vision. Shortness of breath. Rash. Unusual sleepiness. Fever or chills. Trouble speaking. Pain in your eye or ear. Trouble walking or balancing. Feeling faint or passing out. Summary A tension  headache is a feeling of pain, pressure, or aching over the front and sides of the head. A tension headache can last from 30 minutes to several days. It is the most common kind of headache. This condition may be diagnosed based on your symptoms, your medical history, and a physical exam. This condition may be treated with lifestyle changes and with medicines that help relieve symptoms. This information is not intended to replace advice given to you by your health care provider. Make sure you discuss any questions you have with your healthcare provider. Document Revised: 06/19/2020 Document Reviewed: 06/19/2020 Elsevier Patient Education  2022 [employer].

## 2021-04-03 NOTE — Progress Notes (Signed)
Assessment and Plan:  Obsessive-compulsive disorder/depression  with migraines/HA She reports fair control of mood/OCD headaches with new medication; continue  No SI/HI Given information Continue working with getting better housing arrangements Lifestyle discussed: diet/exerise, sleep hygiene, stress management, hydration -     venlafaxine XR (EFFEXOR XR) 150 MG 24 hr capsule; Take 1 capsule (150 mg total) by mouth daily.  Migraines/Tension Headache Continue effexor; tylenol, advil, phenergan PRN are working well for her - add with onset, lifestyle reviewed, needs to improve stress, sleep, hydration Try massage, neck exercises, heat for tension. Try to identify trigger activity and modify. Keep headache log.   Weight gain/overweight  Long discussion about weight loss, diet, and exercise Diet heavy in fruits and veggies and low in animal meats, cheeses, and dairy products, appropriate calorie intake Patient will work on reducing junk food snacking keeping food log - demonstrated free app "Lose it!" As an option Discussed appropriate weight for height  Follow up at next visit  No future appointments.   HPI 40 y.o.female presents for depression/OCD/migraine follow up. She is self pay.    She has a history of OCD/anxiety, tension headaches with occasional migraines migraines. She is on effexor for mood and headache, reports comparable to previous impripramine/celexa. She reports more neck muscle pain/tightness with headache in the last 6 months. Flexeril hasn't been as helpful. Has been doing biofreeze but not heat or massage recently. Did have eyes checked in the last year.   Her mom died in 02/13/17. She has ongoing OCD (excessive handwashing)/anxiety -  takes 3 tabs xanax on days that she's at work, but on days that she is off typically takes 1-2 as needed. Living with dad and step mom, very strained relationship. Cannot move out due to cost barrier.    BMI is Body mass index is  28.24 kg/m., she has not been working on exercise, has gained 40+ lb, up from 133 lb in 12/2018, did have normal TSH check last OV. She admits to eating lots of junk/processed food, avoids kitchen/step mom. Toasted apple strudel for breakfast, broccoli cheese soup for lunch, etc. She reports did get gym membership but hasn't started.  Wt Readings from Last 3 Encounters:  04/03/21 183 lb (83 kg)  09/16/20 172 lb (78 kg)  08/14/20 172 lb (78 kg)   Lab Results  Component Value Date   TSH 2.87 08/14/2020     Past Medical History:  Diagnosis Date   Allergy    Anxiety    Asthma    Depression    Hyperlipidemia    IBS (irritable bowel syndrome)    Migraines    OCD (obsessive compulsive disorder)    TMJ syndrome      Allergies  Allergen Reactions   Zoloft [Sertraline Hcl]     Current Outpatient Medications on File Prior to Visit  Medication Sig   ALPRAZolam (XANAX) 0.5 MG tablet TAKE ONE-HALF TO ONE (0.5-1) TABLET BY MOUTH TWO TO THREE TIMES PER DAY AS NEEDED FOR ANXIETY *DO NOT TAKE EVERY DAY, MUST LAST LONGER THAN 30 DAYS*   Ascorbic Acid (VITAMIN C) 1000 MG tablet Take 1,000 mg by mouth 2 (two) times daily.   cyclobenzaprine (FLEXERIL) 10 MG tablet Take  1/2 to 1 tablet  2-3 x /day  as needed for Muscle Spasms.   promethazine (PHENERGAN) 25 MG tablet Take  1 tablet  every  4 hours  if needed for  Nausea or Headache   venlafaxine XR (EFFEXOR-XR) 150 MG 24 hr  capsule TAKE ONE CAPSULE BY MOUTH DAILY WITH BREAKFAST   dexamethasone (DECADRON) 1 MG tablet Take 3 tabs for 3 days, 2 tabs for 3 days 1 tab for 5 days. Take with food.   promethazine-dextromethorphan (PROMETHAZINE-DM) 6.25-15 MG/5ML syrup Take 5 mLs by mouth 4 (four) times daily as needed for cough. (Patient not taking: Reported on 04/03/2021)   No current facility-administered medications on file prior to visit.    ROS: all negative except above.   Physical Exam: Filed Weights   04/03/21 1131  Weight: 183 lb (83 kg)    BP 110/72   Pulse 93   Temp 97.7 F (36.5 C)   Wt 183 lb (83 kg)   SpO2 99%   BMI 28.24 kg/m  General Appearance: Well developed overweight female in no apparent distress. Eyes: PERRLA, EOMs, conjunctiva no swelling or erythema Sinuses: No Frontal/maxillary tenderness ENT/Mouth: Ext aud canals clear, TMs without erythema, bulging. No erythema, swelling, or exudate on post pharynx.  Tonsils not swollen or erythematous. Hearing normal.  Neck: Supple, thyroid normal.  Respiratory: Respiratory effort normal, BS equal bilaterally without rales, rhonchi, wheezing or stridor.  Cardio: RRR with no MRGs. Brisk peripheral pulses without edema.  Abdomen: Soft, + BS.  Non tender, no guarding, rebound, hernias, masses. Lymphatics: Non tender without lymphadenopathy.  Musculoskeletal: Full ROM, 5/5 strength, normal gait. No spinous tenderness. Lots of tension in traps, paraspinal, lateral neck musculature without spasm Skin: Bilateral hands with erythema, scaling, no warmth, swelling. Warm, dry without rashes, lesions, ecchymosis.  Neuro: Cranial nerves intact. Normal muscle tone, no cerebellar symptoms. Sensation intact.  Psych: Awake and oriented X 3, flat affect, intermittently struggles with eye contact, Insight and Judgment appropriate.     [name], NP 12:16 PM [facility]

## 2021-04-26 ENCOUNTER — Other Ambulatory Visit: Payer: Self-pay | Admitting: Adult Health

## 2021-04-26 DIAGNOSIS — F419 Anxiety disorder, unspecified: Secondary | ICD-10-CM

## 2021-04-28 ENCOUNTER — Other Ambulatory Visit: Payer: Self-pay

## 2021-04-28 DIAGNOSIS — F419 Anxiety disorder, unspecified: Secondary | ICD-10-CM

## 2021-04-30 ENCOUNTER — Other Ambulatory Visit: Payer: Self-pay | Admitting: Adult Health

## 2021-04-30 ENCOUNTER — Telehealth: Payer: Self-pay

## 2021-04-30 MED ORDER — PREDNISONE 20 MG PO TABS: ORAL_TABLET | ORAL | 0 refills | Status: DC

## 2021-04-30 NOTE — Telephone Encounter (Signed)
Patient states that she is having an allergic reaction. It is causing her to have watery, swollen and itchy eyes. Weston Brass noticed it on her at her last visit. Wanting to know if there is something other than benadryl she can take.

## 2021-05-01 NOTE — Telephone Encounter (Signed)
Left message on voicemail letting her know that a prescription was called in and to call the office with any questions.

## 2021-06-05 ENCOUNTER — Other Ambulatory Visit: Payer: Self-pay | Admitting: Adult Health

## 2021-06-05 DIAGNOSIS — F419 Anxiety disorder, unspecified: Secondary | ICD-10-CM

## 2021-06-18 ENCOUNTER — Other Ambulatory Visit: Payer: Self-pay

## 2021-06-18 DIAGNOSIS — F419 Anxiety disorder, unspecified: Secondary | ICD-10-CM

## 2021-06-18 MED ORDER — VENLAFAXINE HCL ER 150 MG PO CP24: ORAL_CAPSULE | ORAL | 1 refills | Status: DC

## 2021-06-19 ENCOUNTER — Ambulatory Visit: Payer: Self-pay

## 2021-06-24 ENCOUNTER — Ambulatory Visit: Payer: Self-pay

## 2021-06-25 ENCOUNTER — Ambulatory Visit: Payer: Self-pay

## 2021-07-10 ENCOUNTER — Other Ambulatory Visit: Payer: Self-pay | Admitting: Nurse Practitioner

## 2021-07-10 ENCOUNTER — Other Ambulatory Visit: Payer: Self-pay | Admitting: Internal Medicine

## 2021-07-10 DIAGNOSIS — F419 Anxiety disorder, unspecified: Secondary | ICD-10-CM

## 2021-07-10 MED ORDER — ALPRAZOLAM 0.5 MG PO TABS: ORAL_TABLET | ORAL | 0 refills | Status: DC

## 2021-07-30 ENCOUNTER — Other Ambulatory Visit: Payer: Self-pay | Admitting: Adult Health

## 2021-07-30 DIAGNOSIS — G43809 Other migraine, not intractable, without status migrainosus: Secondary | ICD-10-CM

## 2021-08-11 ENCOUNTER — Other Ambulatory Visit: Payer: Self-pay | Admitting: Nurse Practitioner

## 2021-08-11 DIAGNOSIS — F419 Anxiety disorder, unspecified: Secondary | ICD-10-CM

## 2021-09-04 ENCOUNTER — Ambulatory Visit (HOSPITAL_COMMUNITY)
Admission: EM | Admit: 2021-09-04 | Discharge: 2021-09-04 | Disposition: A | Discharge status: Home / Self Care | Payer: Self-pay | Attending: Emergency Medicine | Admitting: Emergency Medicine

## 2021-09-04 ENCOUNTER — Encounter (HOSPITAL_COMMUNITY): Payer: Self-pay

## 2021-09-04 ENCOUNTER — Other Ambulatory Visit: Payer: Self-pay

## 2021-09-04 DIAGNOSIS — J069 Acute upper respiratory infection, unspecified: Secondary | ICD-10-CM

## 2021-09-04 DIAGNOSIS — R051 Acute cough: Secondary | ICD-10-CM

## 2021-09-04 MED ORDER — PREDNISONE 10 MG (21) PO TBPK: ORAL_TABLET | Freq: Every day | ORAL | 0 refills | Status: DC

## 2021-09-04 MED ORDER — PROMETHAZINE-DM 6.25-15 MG/5ML PO SYRP: 5.0000 mL | ORAL_SOLUTION | Freq: Four times a day (QID) | ORAL | 0 refills | Status: DC | PRN

## 2021-09-04 MED ORDER — AEROCHAMBER PLUS FLO-VU MEDIUM MISC
1.0000 | Freq: Once | Status: AC
  Administered 2021-09-04: 1

## 2021-09-04 MED ORDER — ALBUTEROL SULFATE HFA 108 (90 BASE) MCG/ACT IN AERS
2.0000 | INHALATION_SPRAY | Freq: Once | RESPIRATORY_TRACT | Status: AC
  Administered 2021-09-04: 2 via RESPIRATORY_TRACT

## 2021-09-04 MED ORDER — AEROCHAMBER PLUS FLO-VU LARGE MISC
Status: AC
  Filled 2021-09-04: qty 1

## 2021-09-04 MED ORDER — ALBUTEROL SULFATE HFA 108 (90 BASE) MCG/ACT IN AERS
INHALATION_SPRAY | RESPIRATORY_TRACT | Status: AC
  Filled 2021-09-04: qty 6.7

## 2021-09-04 NOTE — ED Provider Notes (Signed)
[facility]    CSN: [id number] Arrival date & time: 09/04/21  1424      History   Chief Complaint Chief Complaint  Patient presents with   Cough   Nasal Congestion         HPI Makayla [name] is a 40 y.o. female.  Cough, congestion, sore throat for a week.  Was using Afrin but stopped after 3 days.  Was feeling like she was getting better until she stopped the Afrin and then her congestion returned.  Cough is biggest complaint.  Will have coughing fits and feel chest tightness at night and also during the day.  Lives with her father and she says he needs something for her cough because her cough is bothering him.  Denies wheezing.  Denies history of asthma.  Cough Associated symptoms: no fever, no sore throat and no wheezing    Past Medical History:  Diagnosis Date   Allergy    Anxiety    Asthma    Depression    Hyperlipidemia    IBS (irritable bowel syndrome)    Migraines    OCD (obsessive compulsive disorder)    TMJ syndrome     Patient Active Problem List   Diagnosis Date Noted   Tension-type headache 04/03/2021   Medication management 02/24/2016   Asthma    IBS (irritable bowel syndrome)    Migraines    TMJ syndrome    Depression    Anxiety    OCD (obsessive compulsive disorder)     Past Surgical History:  Procedure Laterality Date   TONSILLECTOMY AND ADENOIDECTOMY      OB History   No obstetric history on file.      Home Medications    Prior to Admission medications   Medication Sig Start Date End Date Taking? Authorizing Provider  predniSONE (STERAPRED UNI-PAK 21 TAB) 10 MG (21) TBPK tablet Take by mouth daily. Take 6 tabs by mouth daily  for 2 days, then 5 tabs for 2 days, then 4 tabs for 2 days, then 3 tabs for 2 days, 2 tabs for 2 days, then 1 tab by mouth daily for 2 days 09/04/21  Yes Cathlyn Parsons, NP  promethazine-dextromethorphan (PROMETHAZINE-DM) 6.25-15 MG/5ML syrup Take 5 mLs by mouth 4 (four) times daily as needed for  cough. 09/04/21  Yes Cathlyn Parsons, NP  ALPRAZolam (XANAX) 0.5 MG tablet TAKE 1/2 TO 1 TABLET BY MOUTH TWO TO THREE TIMES A DAY AS NEEDED FOR ANXIETY. DO NOT TAKE EVERY DAY, *MUST LAST LONGER THAN 30 DAYS* 08/12/21   [name], NP  Ascorbic Acid (VITAMIN C) 1000 MG tablet Take 1,000 mg by mouth 2 (two) times daily.    [provider]  cyclobenzaprine (FLEXERIL) 10 MG tablet TAKE ONE-HALF TO ONE (0.5-1) TABLET TWO TO THREE TIMES A DAY AS NEEDED FOR MUSCLE SPASMS 07/30/21   Lucky Cowboy, MD  promethazine (PHENERGAN) 25 MG tablet Take  1 tablet  every  4 hours  if needed for  Nausea or Headache 12/26/20   Lucky Cowboy, MD  venlafaxine XR (EFFEXOR-XR) 150 MG 24 hr capsule TAKE ONE CAPSULE BY MOUTH DAILY WITH BREAKFAST 06/18/21   [name], NP    Family History History reviewed. No pertinent family history.  Social History Social History   Tobacco Use   Smoking status: Never   Smokeless tobacco: Never  Substance Use Topics   Alcohol use: No   Drug use: No     Allergies  Zoloft [sertraline hcl]   Review of Systems Review of Systems  Constitutional:  Negative for fever.  HENT:  Positive for congestion and postnasal drip. Negative for sore throat.   Respiratory:  Positive for cough and chest tightness. Negative for wheezing.     Physical Exam Triage Vital Signs ED Triage Vitals  Enc Vitals Group     BP 09/04/21 1554 135/84     Pulse Rate 09/04/21 1554 89     Resp 09/04/21 1554 18     Temp 09/04/21 1554 98 F (36.7 C)     Temp Source 09/04/21 1554 Oral     SpO2 09/04/21 1554 100 %     Weight --      Height --      Head Circumference --      Peak Flow --      Pain Score 09/04/21 1552 0     Pain Loc --      Pain Edu? --      Excl. in GC? --    No data found.  Updated Vital Signs BP 135/84 (BP Location: Right Arm)   Pulse 89   Temp 98 F (36.7 C) (Oral)   Resp 18   LMP  (Within Months) Comment: September 2022  SpO2 100%   Visual  Acuity Right Eye Distance:   Left Eye Distance:   Bilateral Distance:    Right Eye Near:   Left Eye Near:    Bilateral Near:     Physical Exam Constitutional:      General: She is not in acute distress.    Appearance: Normal appearance. She is not ill-appearing.  HENT:     Right Ear: Tympanic membrane, ear canal and external ear normal.     Left Ear: Tympanic membrane and ear canal normal.     Nose: Congestion present.     Mouth/Throat:     Mouth: Mucous membranes are moist.     Pharynx: Oropharynx is clear.  Cardiovascular:     Rate and Rhythm: Normal rate and regular rhythm.  Pulmonary:     Effort: Pulmonary effort is normal.     Breath sounds: Examination of the left-upper field reveals wheezing. Wheezing present.     Comments: Mild faint wheezing left upper lobe.  Otherwise lungs clear. Neurological:     Mental Status: She is alert.     UC Treatments / Results  Labs (all labs ordered are listed, but only abnormal results are displayed) Labs Reviewed - No data to display  EKG   Radiology No results found.  Procedures Procedures (including critical care time)  Medications Ordered in UC Medications  albuterol (VENTOLIN HFA) 108 (90 Base) MCG/ACT inhaler 2 puff (2 puffs Inhalation Given 09/04/21 1649)  AeroChamber Plus Flo-Vu Medium MISC 1 each (1 each Other Given 09/04/21 1649)    Initial Impression / Assessment and Plan / UC Course  I have reviewed the triage vital signs and the nursing notes.  Pertinent labs & imaging results that were available during my care of the patient were reviewed by me and considered in my medical decision making (see chart for details).    Patient given albuterol with spacer here in urgent care.  Patient reports she does not feel any different after using it, but lungs now completely clear to auscultation.  Reviewed use of albuterol and spacer at home.  Prescribed Phenergan DM to help with cough and prednisone to help with likely  lung inflammation.  Final Clinical Impressions(s) /  UC Diagnoses   Final diagnoses:  Viral upper respiratory tract infection  Acute cough     Discharge Instructions      You can use the albuterol inhaler 2 puffs every 4-6 hours as needed for wheezing or shortness of breath.   ED Prescriptions     Medication Sig Dispense Auth. Provider   promethazine-dextromethorphan (PROMETHAZINE-DM) 6.25-15 MG/5ML syrup Take 5 mLs by mouth 4 (four) times daily as needed for cough. 118 mL Cathlyn Parsons, NP   predniSONE (STERAPRED UNI-PAK 21 TAB) 10 MG (21) TBPK tablet Take by mouth daily. Take 6 tabs by mouth daily  for 2 days, then 5 tabs for 2 days, then 4 tabs for 2 days, then 3 tabs for 2 days, 2 tabs for 2 days, then 1 tab by mouth daily for 2 days 42 tablet Nahmir Zeidman, Marzella Schlein, NP      PDMP not reviewed this encounter.   Cathlyn Parsons, NP 09/07/21 1128

## 2021-09-04 NOTE — ED Triage Notes (Signed)
Pt reports cough, congestion,  and sore throat after coughing x 1 week.

## 2021-09-04 NOTE — Discharge Instructions (Signed)
You can use the albuterol inhaler 2 puffs every 4-6 hours as needed for wheezing or shortness of breath.

## 2021-09-10 ENCOUNTER — Telehealth: Payer: Self-pay

## 2021-09-10 ENCOUNTER — Other Ambulatory Visit: Payer: Self-pay | Admitting: Adult Health

## 2021-09-10 DIAGNOSIS — F419 Anxiety disorder, unspecified: Secondary | ICD-10-CM

## 2021-09-10 MED ORDER — ALPRAZOLAM 0.5 MG PO TABS: ORAL_TABLET | ORAL | 0 refills | Status: DC

## 2021-09-10 NOTE — Telephone Encounter (Signed)
Refill request for Xanax.  

## 2021-09-10 NOTE — Progress Notes (Signed)
Future Appointments  Date Time Provider Department Center  10/07/2021 10:30 AM [name], NP [facility] None   PDMP reviewed for xanax refill request.

## 2021-10-07 ENCOUNTER — Ambulatory Visit: Payer: Self-pay | Admitting: Adult Health

## 2021-10-10 ENCOUNTER — Other Ambulatory Visit: Payer: Self-pay | Admitting: Adult Health

## 2021-10-10 DIAGNOSIS — F419 Anxiety disorder, unspecified: Secondary | ICD-10-CM

## 2021-10-15 ENCOUNTER — Ambulatory Visit: Payer: Self-pay | Admitting: Adult Health

## 2021-10-21 NOTE — Progress Notes (Deleted)
Assessment and Plan:  Obsessive-compulsive disorder/depression  with migraines/HA She reports fair control of mood/OCD headaches with new medication; continue  No SI/HI Given information Continue working with getting better housing arrangements Lifestyle discussed: diet/exerise, sleep hygiene, stress management, hydration -     venlafaxine XR (EFFEXOR XR) 150 MG 24 hr capsule; Take 1 capsule (150 mg total) by mouth daily.  Migraines/Tension Headache Continue effexor; tylenol, advil, phenergan PRN are working well for her - add with onset, lifestyle reviewed, needs to improve stress, sleep, hydration Try massage, neck exercises, heat for tension. Try to identify trigger activity and modify. Keep headache log.   Weight gain/overweight  Long discussion about weight loss, diet, and exercise Diet heavy in fruits and veggies and low in animal meats, cheeses, and dairy products, appropriate calorie intake Patient will work on reducing junk food snacking keeping food log - demonstrated free app "Lose it!" As an option Discussed appropriate weight for height  Follow up at next visit   No orders of the defined types were placed in this encounter.    Future Appointments  Date Time Provider Department Center  10/22/2021 10:00 AM [name], NP [facility] None     HPI 41 y.o.female presents for 6 month depression/OCD/migraine follow up. She is self pay.    She has a history of OCD/anxiety, tension headaches with occasional migraines migraines. She is on effexor for mood and headache, reports comparable to previous impripramine/celexa. She reports more neck muscle pain/tightness with headache in the last 6 months*** . Flexeril hasn't been as helpful. Has been doing biofreeze but not heat or massage recently. Did have eyes checked in the last year.   Her mom died in 01/22/17. She has ongoing OCD (excessive handwashing)/anxiety -  takes 3 tabs xanax on days that she's at work, but on days  that she is off typically takes 1-2 as needed. Living with dad and step mom, very strained relationship. Cannot move out due to cost barrier.    BMI is There is no height or weight on file to calculate BMI., she has not been working on exercise, has gained 40+ lb, up from 133 lb in 12/2018, did have normal TSH check last OV. She admits to eating lots of junk/processed food, avoids kitchen/step mom. Toasted apple strudel for breakfast, broccoli cheese soup for lunch, etc. She reports did get gym membership but hasn't started.  Wt Readings from Last 3 Encounters:  04/03/21 183 lb (83 kg)  09/16/20 172 lb (78 kg)  08/14/20 172 lb (78 kg)   Lab Results  Component Value Date   TSH 2.87 08/14/2020     Past Medical History:  Diagnosis Date   Allergy    Anxiety    Asthma    Depression    Hyperlipidemia    IBS (irritable bowel syndrome)    Migraines    OCD (obsessive compulsive disorder)    TMJ syndrome      Allergies  Allergen Reactions   Zoloft [Sertraline Hcl]     Current Outpatient Medications on File Prior to Visit  Medication Sig   ALPRAZolam (XANAX) 0.5 MG tablet TAKE 1/2 TO 1 TABLET BY MOUTH TWO TO THREE TIMES A DAY AS NEEDED FOR ANXIETY . DO NOT TAKE EVERY DAY **MUST LAST LONGER THAN 30 DAYS**   Ascorbic Acid (VITAMIN C) 1000 MG tablet Take 1,000 mg by mouth 2 (two) times daily.   cyclobenzaprine (FLEXERIL) 10 MG tablet TAKE ONE-HALF TO ONE (0.5-1) TABLET TWO TO THREE TIMES  A DAY AS NEEDED FOR MUSCLE SPASMS   predniSONE (STERAPRED UNI-PAK 21 TAB) 10 MG (21) TBPK tablet Take by mouth daily. Take 6 tabs by mouth daily  for 2 days, then 5 tabs for 2 days, then 4 tabs for 2 days, then 3 tabs for 2 days, 2 tabs for 2 days, then 1 tab by mouth daily for 2 days   promethazine (PHENERGAN) 25 MG tablet Take  1 tablet  every  4 hours  if needed for  Nausea or Headache   promethazine-dextromethorphan (PROMETHAZINE-DM) 6.25-15 MG/5ML syrup Take 5 mLs by mouth 4 (four) times daily as needed  for cough.   venlafaxine XR (EFFEXOR-XR) 150 MG 24 hr capsule TAKE ONE CAPSULE BY MOUTH DAILY WITH BREAKFAST   No current facility-administered medications on file prior to visit.    ROS: all negative except above.   Physical Exam: There were no vitals filed for this visit.  There were no vitals taken for this visit. General Appearance: Well developed overweight female in no apparent distress. Eyes: PERRLA, EOMs, conjunctiva no swelling or erythema Sinuses: No Frontal/maxillary tenderness ENT/Mouth: Ext aud canals clear, TMs without erythema, bulging. No erythema, swelling, or exudate on post pharynx.  Tonsils not swollen or erythematous. Hearing normal.  Neck: Supple, thyroid normal.  Respiratory: Respiratory effort normal, BS equal bilaterally without rales, rhonchi, wheezing or stridor.  Cardio: RRR with no MRGs. Brisk peripheral pulses without edema.  Abdomen: Soft, + BS.  Non tender, no guarding, rebound, hernias, masses. Lymphatics: Non tender without lymphadenopathy.  Musculoskeletal: Full ROM, 5/5 strength, normal gait. No spinous tenderness. Lots of tension in traps, paraspinal, lateral neck musculature without spasm Skin: Bilateral hands with erythema, scaling, no warmth, swelling. Warm, dry without rashes, lesions, ecchymosis.  Neuro: Cranial nerves intact. Normal muscle tone, no cerebellar symptoms. Sensation intact.  Psych: Awake and oriented X 3, flat affect, intermittently struggles with eye contact, Insight and Judgment appropriate.     [name], NP 1:45 PM [facility]

## 2021-10-22 ENCOUNTER — Ambulatory Visit: Payer: Self-pay | Admitting: Adult Health

## 2021-10-22 DIAGNOSIS — F339 Major depressive disorder, recurrent, unspecified: Secondary | ICD-10-CM

## 2021-10-22 DIAGNOSIS — Z79899 Other long term (current) drug therapy: Secondary | ICD-10-CM

## 2021-10-22 DIAGNOSIS — G43909 Migraine, unspecified, not intractable, without status migrainosus: Secondary | ICD-10-CM

## 2021-10-22 DIAGNOSIS — F419 Anxiety disorder, unspecified: Secondary | ICD-10-CM

## 2021-10-22 DIAGNOSIS — G44219 Episodic tension-type headache, not intractable: Secondary | ICD-10-CM

## 2021-10-22 DIAGNOSIS — F429 Obsessive-compulsive disorder, unspecified: Secondary | ICD-10-CM

## 2021-11-03 ENCOUNTER — Other Ambulatory Visit: Payer: Self-pay | Admitting: Internal Medicine

## 2021-11-03 DIAGNOSIS — G43909 Migraine, unspecified, not intractable, without status migrainosus: Secondary | ICD-10-CM

## 2021-11-10 ENCOUNTER — Other Ambulatory Visit: Payer: Self-pay | Admitting: Adult Health

## 2021-11-10 DIAGNOSIS — F419 Anxiety disorder, unspecified: Secondary | ICD-10-CM

## 2021-11-10 NOTE — Progress Notes (Signed)
No future appointments.  PDMP reviewed for final alprazolam refill, will give last 30 days due to chronic use to avoid abrupt withdrawal. Patient advise no further refills will be provided from our office and she will need to find a new provider to fill next month.

## 2021-12-24 ENCOUNTER — Ambulatory Visit (HOSPITAL_COMMUNITY)
Admission: EM | Admit: 2021-12-24 | Discharge: 2021-12-24 | Disposition: A | Discharge status: Home / Self Care | Payer: No Payment, Other | Attending: Psychiatry | Admitting: Psychiatry

## 2021-12-24 DIAGNOSIS — Z760 Encounter for issue of repeat prescription: Secondary | ICD-10-CM | POA: Insufficient documentation

## 2021-12-24 DIAGNOSIS — F429 Obsessive-compulsive disorder, unspecified: Secondary | ICD-10-CM | POA: Insufficient documentation

## 2021-12-24 DIAGNOSIS — F419 Anxiety disorder, unspecified: Secondary | ICD-10-CM | POA: Insufficient documentation

## 2021-12-24 DIAGNOSIS — Z79899 Other long term (current) drug therapy: Secondary | ICD-10-CM | POA: Insufficient documentation

## 2021-12-24 NOTE — Discharge Instructions (Addendum)
[facility] ?[address] ?[place], [place]......[phone] ? ?[facility] (2nd floor) ?[address] ?[place], [place] .Marland Kitchen..[phone] ? ?[name] [address] [facility],  ?[address] [place], [place], [place] [address] ?[place], [place]......[phone] ? ?Patient is instructed prior to discharge to: ? Take all medications as prescribed by his/her mental healthcare provider. ?Report any adverse effects and or reactions from the medicines to his/her outpatient provider promptly. ?Keep all scheduled appointments, to ensure that you are getting refills on time and to avoid any interruption in your medication.  If you are unable to keep an appointment call to reschedule.  Be sure to follow-up with resources and follow-up appointments provided.  ?Patient has been instructed & cautioned: To not engage in alcohol and or illegal drug use while on prescription medicines. ?In the event of worsening symptoms, patient is instructed to call the crisis hotline, 911 and or go to the nearest ED for appropriate evaluation and treatment of symptoms. ?To follow-up with his/her primary care provider for your other medical issues, concerns and or health care needs. ?  ? ?

## 2021-12-24 NOTE — Progress Notes (Signed)
Patient presents to the [facility] seeking medication management.  She states that her PCP discontinued services with her bcause of missed appointments.  Patient states that she is out of her Alprazolam as well as her Flexeril.  Patient states that she has anxiety attacks and tension headaches.  Patient states that she needs her medication for work.  Patient states that she has never had any prior mental health treatment on an outpatient basis, but states that she was admitted to a mental health hospital as an adolescent. . Patient states that shehas been seeking outpatient mental health services.  Patient states that she has been living with her father and her stepmother, but states that they recently kicked her out. Patient states that she is currently living with a friend.  Patient denies SI/HI/Psychosis. Patient has no history of SA use or self-mutilation. ?

## 2021-12-24 NOTE — Progress Notes (Signed)
Patient presents to the [facility] seeking medication management.  She states that her PCP discontinued services with her bcause of missed appointments.  Patient states that she is out of her Alprazolam as well as her Flexeril.  Patient states that she has anxiety attacks and tension headaches.  Patient states that she needs her medication for work.  Patient states that she has never had any prior mental health treatment on an outpatient basis, but states that she was admitted to a mental health hospital as an adolescent. . Patient states that shehas been seeking outpatient mental health services.  Patient states that she has been living with her father and her stepmother, but states that they recently kicked her out. Patient states that she is currently living with a friend.  Patient denies SI/HI/Psychosis. Patient has no history of SA use or self-mutilation. Patient is routine. ?

## 2021-12-24 NOTE — ED Provider Notes (Signed)
Behavioral Health Urgent Care Medical Screening Exam ? ?Patient Name: Makayla [name] ?MRN: [id number] ?Date of Evaluation: 12/24/21 ?Chief Complaint:   ?Diagnosis:  ?Final diagnoses:  ?Medication refill  ?Anxiety disorder, unspecified type  ? ? ?History of Present illness: Makayla [name] is a 41 y.o. female. Patient presents voluntarily to [facility] behavioral health for walk-in assessment.   ? ?Makayla [name] reports she would like to be linked with outpatient counseling and medication management providers. She also requests refill for current medication, alprazolam.  ? ?Patient reports history of major depressive disorder, obsessive-compulsive disorder and anxiety.  Makayla [name] has been followed by primary care provider, Dr. Larena Glassman, with [facility] She has recently been dismissed by primary care provider related to missed appointments. She has been stable on alprazolam PRN. Flexeril PRN and venlafaxine.  ? ?She endorses history of one previous psychiatric hospitalization approx. 30 years ago. She reports while in middle school she had a strained relationship with her mother and was briefly admitted for stabilization. No reported family mental health history.  ? ?She reports she experiences anxiety mainly when she is at work.  She only uses alprazolam on the days she works.  She last had a dose of alprazolam on Monday, 4 days ago.  She reports she only has a limited number of alprazolam tablets left and she needs these in order to be able to work.  She is employed in [employer]. ? ?Patient is assessed face-to-face by nurse practitioner.  She is seated in assessment area, no acute distress.  She is alert and oriented, pleasant and cooperative during assessment.  ?She presents with euthymic mood, congruent affect. She denies suicidal and homicidal ideations. She denies history of suicide attempts, denies history of non suicidal self-harm behavior.  She contracts verbally for safety  with this Clinical research associate. ?  She has normal speech and behavior.  She denies auditory and visual hallucinations.  Patient is able to converse coherently with goal-directed thoughts and no distractibility or preoccupation.  He denies paranoia.  Objectively there is no evidence of psychosis/mania or delusional thinking. ? ?Makayla [name] resides in [place] with a friend, she denies access to weapons. Patient endorses average sleep and appetite. She denies alcohol and substance use.  ? ?Patient offered support and encouragement.  ? ?Patient verbalizes understanding of return precautions.  ?Patient educated and verbalizes understanding of mental health resources and other crisis services in the community. She is instructed to call 911 and present to the nearest emergency room should she experience any suicidal/homicidal ideation, auditory/visual/hallucinations, or detrimental worsening of her mental health condition.   ? ?Psychiatric Specialty Exam ? ?Presentation  ?General Appearance:Appropriate for Environment; Casual ? ?Eye Contact:Good ? ?Speech:Clear and Coherent; Normal Rate ? ?Speech Volume:Normal ? ?Handedness:Right ? ? ?Mood and Affect  ?Mood:Euthymic ? ?Affect:Appropriate; Congruent ? ? ?Thought Process  ?Thought Processes:Coherent; Goal Directed; Linear ? ?Descriptions of Associations:Intact ? ?Orientation:Full (Time, Place and Person) ? ?Thought Content:Logical; WDL ?   Hallucinations:None ? ?Ideas of Reference:None ? ?Suicidal Thoughts:No ? ?Homicidal Thoughts:No ? ? ?Sensorium  ?Memory:Immediate Good; Recent Good ? ?Judgment:Good ? ?Insight:Fair ? ? ?Executive Functions  ?Concentration:Good ? ?Attention Span:Good ? ?Recall:Good ? ?Fund of Knowledge:Good ? ?Language:Good ? ? ?Psychomotor Activity  ?Psychomotor Activity:Normal ? ? ?Assets  ?Assets:Communication Skills; Desire for Improvement; Financial Resources/Insurance; Intimacy; Leisure Time; Physical Health; Social Support; Housing; Resilience ? ? ?Sleep   ?Sleep:Good ? ?Number of hours: No data recorded ? ?No data recorded ? ?Physical Exam: ?Physical Exam ?Vitals and nursing  note reviewed.  ?Constitutional:   ?   Appearance: Normal appearance. She is well-developed.  ?HENT:  ?   Head: Normocephalic and atraumatic.  ?   Nose: Nose normal.  ?Cardiovascular:  ?   Rate and Rhythm: Normal rate.  ?Pulmonary:  ?   Effort: Pulmonary effort is normal.  ?Musculoskeletal:     ?   General: Normal range of motion.  ?   Cervical back: Normal range of motion.  ?Skin: ?   General: Skin is warm and dry.  ?Neurological:  ?   Mental Status: She is alert and oriented to person, place, and time.  ?Psychiatric:     ?   Attention and Perception: Attention and perception normal.     ?   Mood and Affect: Mood and affect normal.     ?   Speech: Speech normal.     ?   Behavior: Behavior normal. Behavior is cooperative.     ?   Thought Content: Thought content normal.     ?   Cognition and Memory: Cognition and memory normal.     ?   Judgment: Judgment normal.  ? ?Review of Systems  ?Constitutional: Negative.   ?HENT: Negative.    ?Eyes: Negative.   ?Respiratory: Negative.    ?Cardiovascular: Negative.   ?Gastrointestinal: Negative.   ?Genitourinary: Negative.   ?Musculoskeletal: Negative.   ?Skin: Negative.   ?Neurological: Negative.   ?Endo/Heme/Allergies: Negative.   ?Psychiatric/Behavioral: Negative.    ?Blood pressure 122/88, pulse 86, temperature 98.7 ?F (37.1 ?C), temperature source Oral, resp. rate 19, SpO2 99 %. There is no height or weight on file to calculate BMI. ? ?Musculoskeletal: ?Strength & Muscle Tone: within normal limits ?Gait & Station: normal ?Patient leans: N/A ? ? ?[facility] Discharge Disposition for Follow up and Recommendations: ?Based on my evaluation the patient does not appear to have an emergency medical condition and can be discharged with resources and follow up care in outpatient services for Medication Management and Individual Therapy ?Patient reviewed with Dr  Bronwen Betters. ?Follow up with outpatient psychiatry, resources provided.  ? ? ?Lenard Lance, FNP ?12/24/2021, 4:29 PM ? ?

## 2022-01-23 ENCOUNTER — Telehealth (HOSPITAL_COMMUNITY): Payer: Self-pay

## 2022-01-23 NOTE — BH Assessment (Signed)
Care Management - [facility] Follow Up Discharges  ? ?Writer attempted to make contact with patient today and was unsuccessful.  Writer left a HIPPA compliant voice message.  ? ?Per chart review, patient will follow up with established primary care provider, Dr. Larena Glassman, with [facility] ?

## 2022-01-27 ENCOUNTER — Emergency Department (HOSPITAL_COMMUNITY): Payer: Self-pay

## 2022-01-27 ENCOUNTER — Emergency Department (HOSPITAL_COMMUNITY)
Admission: EM | Admit: 2022-01-27 | Discharge: 2022-01-27 | Disposition: A | Discharge status: Home / Self Care | Payer: Self-pay | Attending: Emergency Medicine | Admitting: Emergency Medicine

## 2022-01-27 ENCOUNTER — Other Ambulatory Visit: Payer: Self-pay

## 2022-01-27 ENCOUNTER — Ambulatory Visit: Payer: Self-pay

## 2022-01-27 ENCOUNTER — Encounter (HOSPITAL_COMMUNITY): Payer: Self-pay | Admitting: Emergency Medicine

## 2022-01-27 DIAGNOSIS — R0789 Other chest pain: Secondary | ICD-10-CM | POA: Insufficient documentation

## 2022-01-27 DIAGNOSIS — F419 Anxiety disorder, unspecified: Secondary | ICD-10-CM | POA: Insufficient documentation

## 2022-01-27 LAB — CBC
HCT: 43.8 % (ref 36.0–46.0)
Hemoglobin: 14.8 g/dL (ref 12.0–15.0)
MCH: 30.9 pg (ref 26.0–34.0)
MCHC: 33.8 g/dL (ref 30.0–36.0)
MCV: 91.4 fL (ref 80.0–100.0)
Platelets: 226 10*3/uL (ref 150–400)
RBC: 4.79 MIL/uL (ref 3.87–5.11)
RDW: 11.6 % (ref 11.5–15.5)
WBC: 6.8 10*3/uL (ref 4.0–10.5)
nRBC: 0 % (ref 0.0–0.2)

## 2022-01-27 LAB — TROPONIN I (HIGH SENSITIVITY): Troponin I (High Sensitivity): <2 ng/L (ref <18)

## 2022-01-27 LAB — BASIC METABOLIC PANEL
Anion gap: 5 (ref 5–15)
BUN: 11 mg/dL (ref 6–20)
CO2: 26 mmol/L (ref 22–32)
Calcium: 9.2 mg/dL (ref 8.9–10.3)
Chloride: 106 mmol/L (ref 98–111)
Creatinine, Ser: 0.74 mg/dL (ref 0.44–1.00)
GFR, Estimated: >60 mL/min (ref >60)
Glucose, Bld: 94 mg/dL (ref 70–99)
Potassium: 4 mmol/L (ref 3.5–5.1)
Sodium: 137 mmol/L (ref 135–145)

## 2022-01-27 NOTE — ED Notes (Signed)
I provided reinforced discharge education based off of discharge instructions. Pt acknowledged and understood my education. Pt had no further questions/concerns for provider/myself.  °

## 2022-01-27 NOTE — Discharge Instructions (Signed)
Turn for any problem. ? ?Work-up today is without evidence of significant problems such as heart attack or other life-threatening condition. ? ?It is important that you follow-up closely with an outpatient care provider as instructed. ? ?Unfortunately, the ED cannot provide refills on controlled substances such as Xanax. ?

## 2022-01-27 NOTE — ED Triage Notes (Signed)
Pt reports needing a medication refill on her anxiety meds. Pt reports d/t not having her meds, she is now experiencing chest pain and SHOB. Pt reports taking 2-3 xanax per day.  ?

## 2022-01-27 NOTE — ED Provider Triage Note (Signed)
Emergency Medicine Provider Triage Evaluation Note ? ?Makayla [name] , a 41 y.o. anxious appearing female  was evaluated in triage.  Pt complains of increasing pressure on her chest and chest tightness.  Hx of anxiety.  States he has felt this before about 1 to 2 years ago.  Used to be on alprazolam for which the patient states she would take 2-3 times per day (90 tablet supply in 30 days).  States she was to be had again tried to receive counseling/treatment but was denied.  States she is tired of people not wanting to help her.  Unable to clearly state whether she is having SI or HI.  States she is in a constant state of aggravation and is recently had her hours cut from work due to her increasing agitation towards people.  Denies nausea/vomiting, or fever.  Denies shortness of breath.  Denies chest pain. ? ?Review of Systems  ?Positive: As above ?Negative: As above ? ?Physical Exam  ?BP 128/86 (BP Location: Left Arm)   Pulse (!) 108   Temp 98.8 ?F (37.1 ?C)   Resp 18   SpO2 100%  ?Gen:   Awake, no distress, appears anxious ?Resp:  Normal effort, CTAB ?MSK:   Moves extremities without difficulty  ?Other:  RRR without M/R/G.  Abdomen soft nontender ? ?Medical Decision Making  ?Medically screening exam initiated at 2:18 PM.  Appropriate orders placed.  DEDRA MATSUO was informed that the remainder of the evaluation will be completed by another provider, this initial triage assessment does not replace that evaluation, and the importance of remaining in the ED until their evaluation is complete. ? ?Labs, imaging, EKG ordered ?  ?Cecil Cobbs, PA-C ?01/27/22 1434 ? ?

## 2022-01-27 NOTE — ED Provider Notes (Signed)
?[facility]-EMERGENCY DEPT ?Provider Note ? ? ?CSN: [id number] ?Arrival date & time: 01/27/22  1338 ? ?  ? ?History ? ?Chief Complaint  ?Patient presents with  ? Anxiety  ? Medication Refill  ? ? ?Makayla [name] is a 41 y.o. female. ? ?41 year old female with prior medical history as detailed below presents for evaluation. ? ?Patient reports longstanding history of use of Xanax for anxiety.  Apparently she [facility].  She has been unable to find a provider to refill this medication for her. ? ?She complains of vague chest discomfort that has been constant for the last 48 hours.  She feels that this may be related to her anxiety.  She reports that in the past when she has had this, symptoms she would take a Xanax and she would feel better.  Since she no longer has Xanax and she is concerned that perhaps there is something else going on. ? ?She denies associated shortness of breath.  She denies fever.  She denies cough.  Her discomfort is constant.  She feels that her anxiety levels are increased from her baseline. ? ?The history is provided by the patient and medical records.  ?Anxiety ?This is a chronic problem. The current episode started more than 2 days ago. The problem occurs daily. The problem has not changed since onset.Associated symptoms include chest pain. Pertinent negatives include no abdominal pain. Nothing aggravates the symptoms. Nothing relieves the symptoms.  ?Medication Refill ? ?  ? ?Home Medications ?Prior to Admission medications   ?Medication Sig Start Date End Date Taking? Authorizing Provider  ?ALPRAZolam (XANAX) 0.5 MG tablet TAKE ONE-HALF TO ONE (0.5-1) TABLET BY MOUTH TWO TO THREE TIMES A DAY AS NEEDED FOR ANXIETY. DO NOT TAKE EVERY DAY *MUST LAST LONGER THAN 30 DAYS*. No further refills will be provided from our office as chart is now closed. 11/10/21   Judd Gaudierorbett, Ashley, NP  ?Ascorbic Acid (VITAMIN C) 1000 MG tablet Take 1,000 mg  by mouth 2 (two) times daily.    [provider]  ?cyclobenzaprine (FLEXERIL) 10 MG tablet TAKE ONE-HALF TO ONE (0.5-1) TABLET TWO TO THREE TIMES A DAY AS NEEDED FOR MUSCLE SPASMS 07/30/21   Lucky CowboyMcKeown, William, MD  ?predniSONE (STERAPRED UNI-PAK 21 TAB) 10 MG (21) TBPK tablet Take by mouth daily. Take 6 tabs by mouth daily  for 2 days, then 5 tabs for 2 days, then 4 tabs for 2 days, then 3 tabs for 2 days, 2 tabs for 2 days, then 1 tab by mouth daily for 2 days 09/04/21   Cathlyn ParsonsKabbe, Angela M, NP  ?promethazine (PHENERGAN) 25 MG tablet TAKE ONE TABLET BY MOUTH EVERY 4 HOURS AS NEEDED FOR NAUSEA OR HEADACHE 11/03/21   Judd Gaudierorbett, Ashley, NP  ?promethazine-dextromethorphan (PROMETHAZINE-DM) 6.25-15 MG/5ML syrup Take 5 mLs by mouth 4 (four) times daily as needed for cough. 09/04/21   Cathlyn ParsonsKabbe, Angela M, NP  ?venlafaxine XR (EFFEXOR-XR) 150 MG 24 hr capsule TAKE ONE CAPSULE BY MOUTH DAILY WITH BREAKFAST 06/18/21   Judd Gaudierorbett, Ashley, NP  ?   ? ?Allergies    ?Zoloft [sertraline hcl]   ? ?Review of Systems   ?Review of Systems  ?Cardiovascular:  Positive for chest pain.  ?Gastrointestinal:  Negative for abdominal pain.  ?All other systems reviewed and are negative. ? ?Physical Exam ?Updated Vital Signs ?BP 128/86 (BP Location: Left Arm)   Pulse (!) 108   Temp 98.8 ?F (37.1 ?C)   Resp 18  SpO2 100%  ?Physical Exam ?Vitals and nursing note reviewed.  ?Constitutional:   ?   General: She is not in acute distress. ?   Appearance: Normal appearance. She is well-developed.  ?HENT:  ?   Head: Normocephalic and atraumatic.  ?Eyes:  ?   Conjunctiva/sclera: Conjunctivae normal.  ?   Pupils: Pupils are equal, round, and reactive to light.  ?Cardiovascular:  ?   Rate and Rhythm: Normal rate and regular rhythm.  ?   Heart sounds: Normal heart sounds.  ?Pulmonary:  ?   Effort: Pulmonary effort is normal. No respiratory distress.  ?   Breath sounds: Normal breath sounds.  ?Abdominal:  ?   General: There is no distension.  ?   Palpations:  Abdomen is soft.  ?   Tenderness: There is no abdominal tenderness.  ?Musculoskeletal:     ?   General: No deformity. Normal range of motion.  ?   Cervical back: Normal range of motion and neck supple.  ?Skin: ?   General: Skin is warm and dry.  ?Neurological:  ?   General: No focal deficit present.  ?   Mental Status: She is alert and oriented to person, place, and time.  ? ? ?ED Results / Procedures / Treatments   ?Labs ?(all labs ordered are listed, but only abnormal results are displayed) ?Labs Reviewed  ?BASIC METABOLIC PANEL  ?CBC  ?TROPONIN I (HIGH SENSITIVITY)  ? ? ?EKG ?EKG Interpretation ? ?Date/Time:  Wednesday January 27 2022 13:55:17 EDT ?Ventricular Rate:  109 ?PR Interval:  112 ?QRS Duration: 78 ?QT Interval:  314 ?QTC Calculation: 422 ?R Axis:   -46 ?Text Interpretation: Sinus tachycardia Left axis deviation Anterior infarct , age undetermined Abnormal ECG When compared with ECG of 22-Mar-2005 07:32, PREVIOUS ECG IS PRESENT Confirmed by Kristine Royal [phone]) on 01/27/2022 4:10:20 PM ? ?Radiology ?DG Chest 2 View ? ?Result Date: 01/27/2022 ?CLINICAL DATA:  Chest pressure EXAM: CHEST - 2 VIEW COMPARISON:  03/22/2005 FINDINGS: The heart size and mediastinal contours are within normal limits. Both lungs are clear. The visualized skeletal structures are unremarkable. IMPRESSION: No acute abnormality of the lungs. Electronically Signed   By: Jearld Lesch M.D.   On: 01/27/2022 14:57   ? ?Procedures ?Procedures  ? ? ?Medications Ordered in ED ?Medications - No data to display ? ?ED Course/ Medical Decision Making/ A&P ?  ?                        ?Medical Decision Making ? ? ?Medical Screen Complete ? ?This patient presented to the ED with complaint of anxiety, chest discomfort. ? ?This complaint involves an extensive number of treatment options. The initial differential diagnosis includes, but is not limited to, anxiety, ACS, metabolic abnormality, etc. ? ?This presentation is: Acute, Chronic, Self-Limited,  Previously Undiagnosed, Uncertain Prognosis, Complicated, and Systemic Symptoms ? ?Patient with longstanding history of anxiety.  Patient is currently no longer prescribe Xanax. ? ?She is presenting with complaint of chest discomfort that is highly atypical.  Describes symptoms are perhaps most consistently related to her high levels of anxiety. ? ?EKG is without evidence of acute ischemia.  Troponin is undetectable.  Other obtained labs are without significant abnormality. ? ?Patient feels significant improved after ED evaluation.  She does understand need for close outpatient follow-up.  Strict return precautions given and understood. ? ? ?Co morbidities that complicated the patient's evaluation ? ?Chronic anxiety ? ? ?Additional history obtained: ? ?External records  from outside sources obtained and reviewed including prior ED visits and prior Inpatient records.  ? ? ?Lab Tests: ? ?I ordered and personally interpreted labs.  The pertinent results include: CBC, BMP, troponin ? ? ?Imaging Studies ordered: ? ?I ordered imaging studies including chest x-ray ?I independently visualized and interpreted obtained imaging which showed NAD ?I agree with the radiologist interpretation. ? ? ?Cardiac Monitoring: ? ?The patient was maintained on a cardiac monitor.  I personally viewed and interpreted the cardiac monitor which showed an underlying rhythm of: NSR ? ?Problem List / ED Course: ? ?Atypical chest pain, anxiety ? ? ?Reevaluation: ? ?After the interventions noted above, I reevaluated the patient and found that they have: improved ? ?Disposition: ? ?After consideration of the diagnostic results and the patients response to treatment, I feel that the patent would benefit from close outpatient follow-up.  ? ? ? ? ? ? ? ? ?Final Clinical Impression(s) / ED Diagnoses ?Final diagnoses:  ?Atypical chest pain  ? ? ?Rx / DC Orders ?ED Discharge Orders   ? ? None  ? ?  ? ? ?  ?Wynetta Fines, MD ?01/27/22 1730 ? ?

## 2022-03-03 ENCOUNTER — Encounter: Payer: Self-pay | Admitting: Adult Health

## 2022-03-03 ENCOUNTER — Other Ambulatory Visit: Payer: Self-pay | Admitting: Adult Health

## 2022-03-03 DIAGNOSIS — F419 Anxiety disorder, unspecified: Secondary | ICD-10-CM

## 2022-07-02 ENCOUNTER — Other Ambulatory Visit: Payer: Self-pay | Admitting: Internal Medicine

## 2023-01-10 DIAGNOSIS — F419 Anxiety disorder, unspecified: Secondary | ICD-10-CM | POA: Diagnosis not present

## 2023-01-17 DIAGNOSIS — F419 Anxiety disorder, unspecified: Secondary | ICD-10-CM | POA: Diagnosis not present

## 2023-01-24 DIAGNOSIS — F419 Anxiety disorder, unspecified: Secondary | ICD-10-CM | POA: Diagnosis not present

## 2023-02-07 DIAGNOSIS — F419 Anxiety disorder, unspecified: Secondary | ICD-10-CM | POA: Diagnosis not present

## 2023-02-08 ENCOUNTER — Encounter (HOSPITAL_COMMUNITY): Payer: Self-pay

## 2023-02-08 ENCOUNTER — Ambulatory Visit (HOSPITAL_COMMUNITY)
Admission: EM | Admit: 2023-02-08 | Discharge: 2023-02-08 | Disposition: A | Discharge status: Home / Self Care | Payer: Medicaid Other | Attending: Family Medicine | Admitting: Family Medicine

## 2023-02-08 DIAGNOSIS — R051 Acute cough: Secondary | ICD-10-CM | POA: Diagnosis not present

## 2023-02-08 DIAGNOSIS — J0140 Acute pansinusitis, unspecified: Secondary | ICD-10-CM

## 2023-02-08 MED ORDER — AZITHROMYCIN 250 MG PO TABS: ORAL_TABLET | ORAL | 0 refills | Status: DC

## 2023-02-08 MED ORDER — PROMETHAZINE-DM 6.25-15 MG/5ML PO SYRP: 5.0000 mL | ORAL_SOLUTION | Freq: Three times a day (TID) | ORAL | 0 refills | Status: DC | PRN

## 2023-02-08 NOTE — ED Provider Notes (Signed)
[facility]    CSN: [id number] Arrival date & time: 02/08/23  1642      History   Chief Complaint Chief Complaint  Patient presents with   Cough    HPI Makayla [name] is a 42 y.o. female.   HPI Patient with history of asthma presents today with over 7 days of cough, facial pressure, congestion and ear pressure.  Has no known sick exposures.  She denies any shortness of breath.  Some over-the-counter medications without any relief.  She denies fever.  Past Medical History:  Diagnosis Date   Allergy    Anxiety    Asthma    Depression    Hyperlipidemia    IBS (irritable bowel syndrome)    Migraines    OCD (obsessive compulsive disorder)    TMJ syndrome     Patient Active Problem List   Diagnosis Date Noted   Tension-type headache 04/03/2021   Medication management 02/24/2016   Asthma    IBS (irritable bowel syndrome)    Migraines    TMJ syndrome    Depression    Anxiety    OCD (obsessive compulsive disorder)     Past Surgical History:  Procedure Laterality Date   TONSILLECTOMY AND ADENOIDECTOMY      OB History   No obstetric history on file.      Home Medications    Prior to Admission medications   Medication Sig Start Date End Date Taking? Authorizing Provider  azithromycin (ZITHROMAX) 250 MG tablet Take 2 tabs PO x 1 dose, then 1 tab PO QD x 4 days 02/08/23  Yes Bing Neighbors, NP  promethazine-dextromethorphan (PROMETHAZINE-DM) 6.25-15 MG/5ML syrup Take 5 mLs by mouth 3 (three) times daily as needed for cough. 02/08/23  Yes Bing Neighbors, NP  ALPRAZolam (XANAX) 0.5 MG tablet TAKE ONE-HALF TO ONE (0.5-1) TABLET BY MOUTH TWO TO THREE TIMES A DAY AS NEEDED FOR ANXIETY. DO NOT TAKE EVERY DAY *MUST LAST LONGER THAN 30 DAYS*. No further refills will be provided from our office as chart is now closed. 11/10/21   [name], NP  Ascorbic Acid (VITAMIN C) 1000 MG tablet Take 1,000 mg by mouth 2 (two) times daily.    [provider]  cyclobenzaprine (FLEXERIL) 10 MG tablet TAKE ONE-HALF TO ONE (0.5-1) TABLET TWO TO THREE TIMES A DAY AS NEEDED FOR MUSCLE SPASMS 07/30/21   Lucky Cowboy, MD  predniSONE (STERAPRED UNI-PAK 21 TAB) 10 MG (21) TBPK tablet Take by mouth daily. Take 6 tabs by mouth daily  for 2 days, then 5 tabs for 2 days, then 4 tabs for 2 days, then 3 tabs for 2 days, 2 tabs for 2 days, then 1 tab by mouth daily for 2 days 09/04/21   Cathlyn Parsons, NP  promethazine (PHENERGAN) 25 MG tablet TAKE ONE TABLET BY MOUTH EVERY 4 HOURS AS NEEDED FOR NAUSEA OR HEADACHE 11/03/21   [name], NP  venlafaxine XR (EFFEXOR-XR) 150 MG 24 hr capsule TAKE ONE CAPSULE BY MOUTH DAILY WITH BREAKFAST 06/18/21   [name], NP    Family History History reviewed. No pertinent family history.  Social History Social History   Tobacco Use   Smoking status: Never   Smokeless tobacco: Never  Substance Use Topics   Alcohol use: No   Drug use: No     Allergies   Zoloft [sertraline hcl]   Review of Systems Review of Systems Pertinent negatives listed in HPI  Physical Exam Triage Vital  Signs ED Triage Vitals  Enc Vitals Group     BP 02/08/23 1734 119/78     Pulse Rate 02/08/23 1734 85     Resp 02/08/23 1734 16     Temp 02/08/23 1734 97.9 F (36.6 C)     Temp Source 02/08/23 1734 Oral     SpO2 02/08/23 1734 99 %     Weight --      Height --      Head Circumference --      Peak Flow --      Pain Score 02/08/23 1735 0     Pain Loc --      Pain Edu? --      Excl. in GC? --    No data found.  Updated Vital Signs BP 119/78 (BP Location: Right Arm)   Pulse 85   Temp 97.9 F (36.6 C) (Oral)   Resp 16   LMP  (LMP Unknown)   SpO2 99%   Visual Acuity Right Eye Distance:   Left Eye Distance:   Bilateral Distance:    Right Eye Near:   Left Eye Near:    Bilateral Near:     Physical Exam Vitals reviewed.  Constitutional:      Appearance: Normal appearance. She is ill-appearing.  HENT:      Right Ear: External ear normal.     Left Ear: External ear normal.     Nose: Congestion and rhinorrhea present.  Eyes:     Extraocular Movements: Extraocular movements intact.     Pupils: Pupils are equal, round, and reactive to light.  Cardiovascular:     Rate and Rhythm: Normal rate and regular rhythm.  Pulmonary:     Effort: Pulmonary effort is normal.     Breath sounds: Normal breath sounds.  Musculoskeletal:     Cervical back: Normal range of motion.  Lymphadenopathy:     Cervical: No cervical adenopathy.  Skin:    General: Skin is warm.  Neurological:     General: No focal deficit present.     Mental Status: She is alert.      UC Treatments / Results  Labs (all labs ordered are listed, but only abnormal results are displayed) Labs Reviewed - No data to display  EKG   Radiology No results found.  Procedures Procedures (including critical care time)  Medications Ordered in UC Medications - No data to display  Initial Impression / Assessment and Plan / UC Course  I have reviewed the triage vital signs and the nursing notes.  Pertinent labs & imaging results that were available during my care of the patient were reviewed by me and considered in my medical decision making (see chart for details).    Acute nonrecurrent pansinusitis greater than 7 days with cough Sinusitis treatment with azithromycin and management of acute cough promethazine DM for cough. Hydrate well with fluids.  Tylenol or ibuprofen as needed for headache.   Return precautions given if symptoms worsen or do not improve. Final Clinical Impressions(s) / UC Diagnoses   Final diagnoses:  Acute non-recurrent pansinusitis  Acute cough   Discharge Instructions   None    ED Prescriptions     Medication Sig Dispense Auth. Provider   azithromycin (ZITHROMAX) 250 MG tablet Take 2 tabs PO x 1 dose, then 1 tab PO QD x 4 days 6 tablet Bing Neighbors, NP   promethazine-dextromethorphan  (PROMETHAZINE-DM) 6.25-15 MG/5ML syrup Take 5 mLs by mouth 3 (three) times daily as needed  for cough. 180 mL Bing Neighbors, NP      PDMP not reviewed this encounter.   Bing Neighbors, NP 02/08/23 1800

## 2023-02-08 NOTE — ED Triage Notes (Signed)
Pt states cough and congestion for the past week. Has been taking benadryl and dayquil at home.

## 2023-02-14 DIAGNOSIS — F419 Anxiety disorder, unspecified: Secondary | ICD-10-CM | POA: Diagnosis not present

## 2023-02-16 IMAGING — CR DG CHEST 2V
2 series · 2 of 2 positions shown · non-contrast
Comparison: 03/22/2005

CLINICAL DATA: Chest pressure

EXAM:
CHEST - 2 VIEW

[w chest pa]
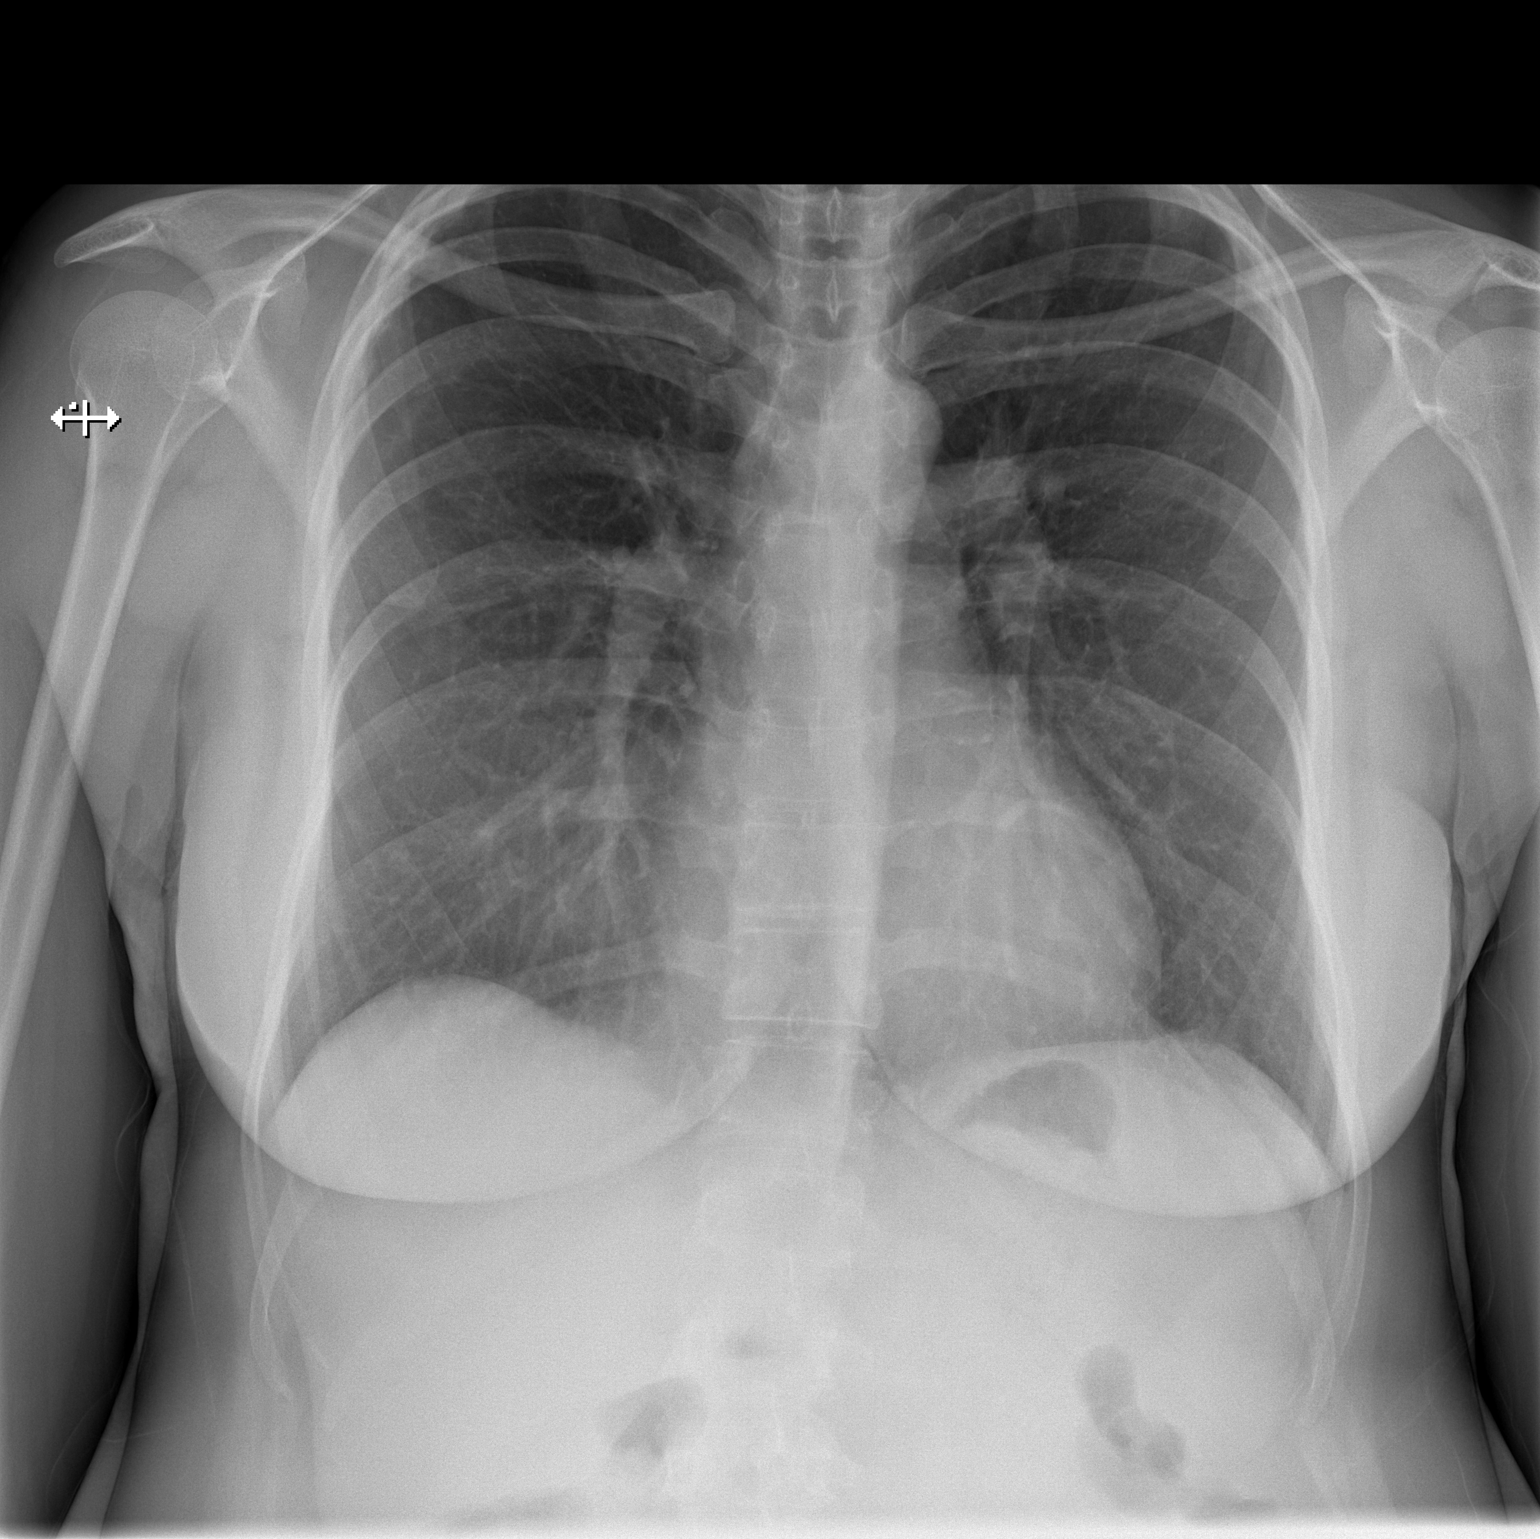

[w chest lat]
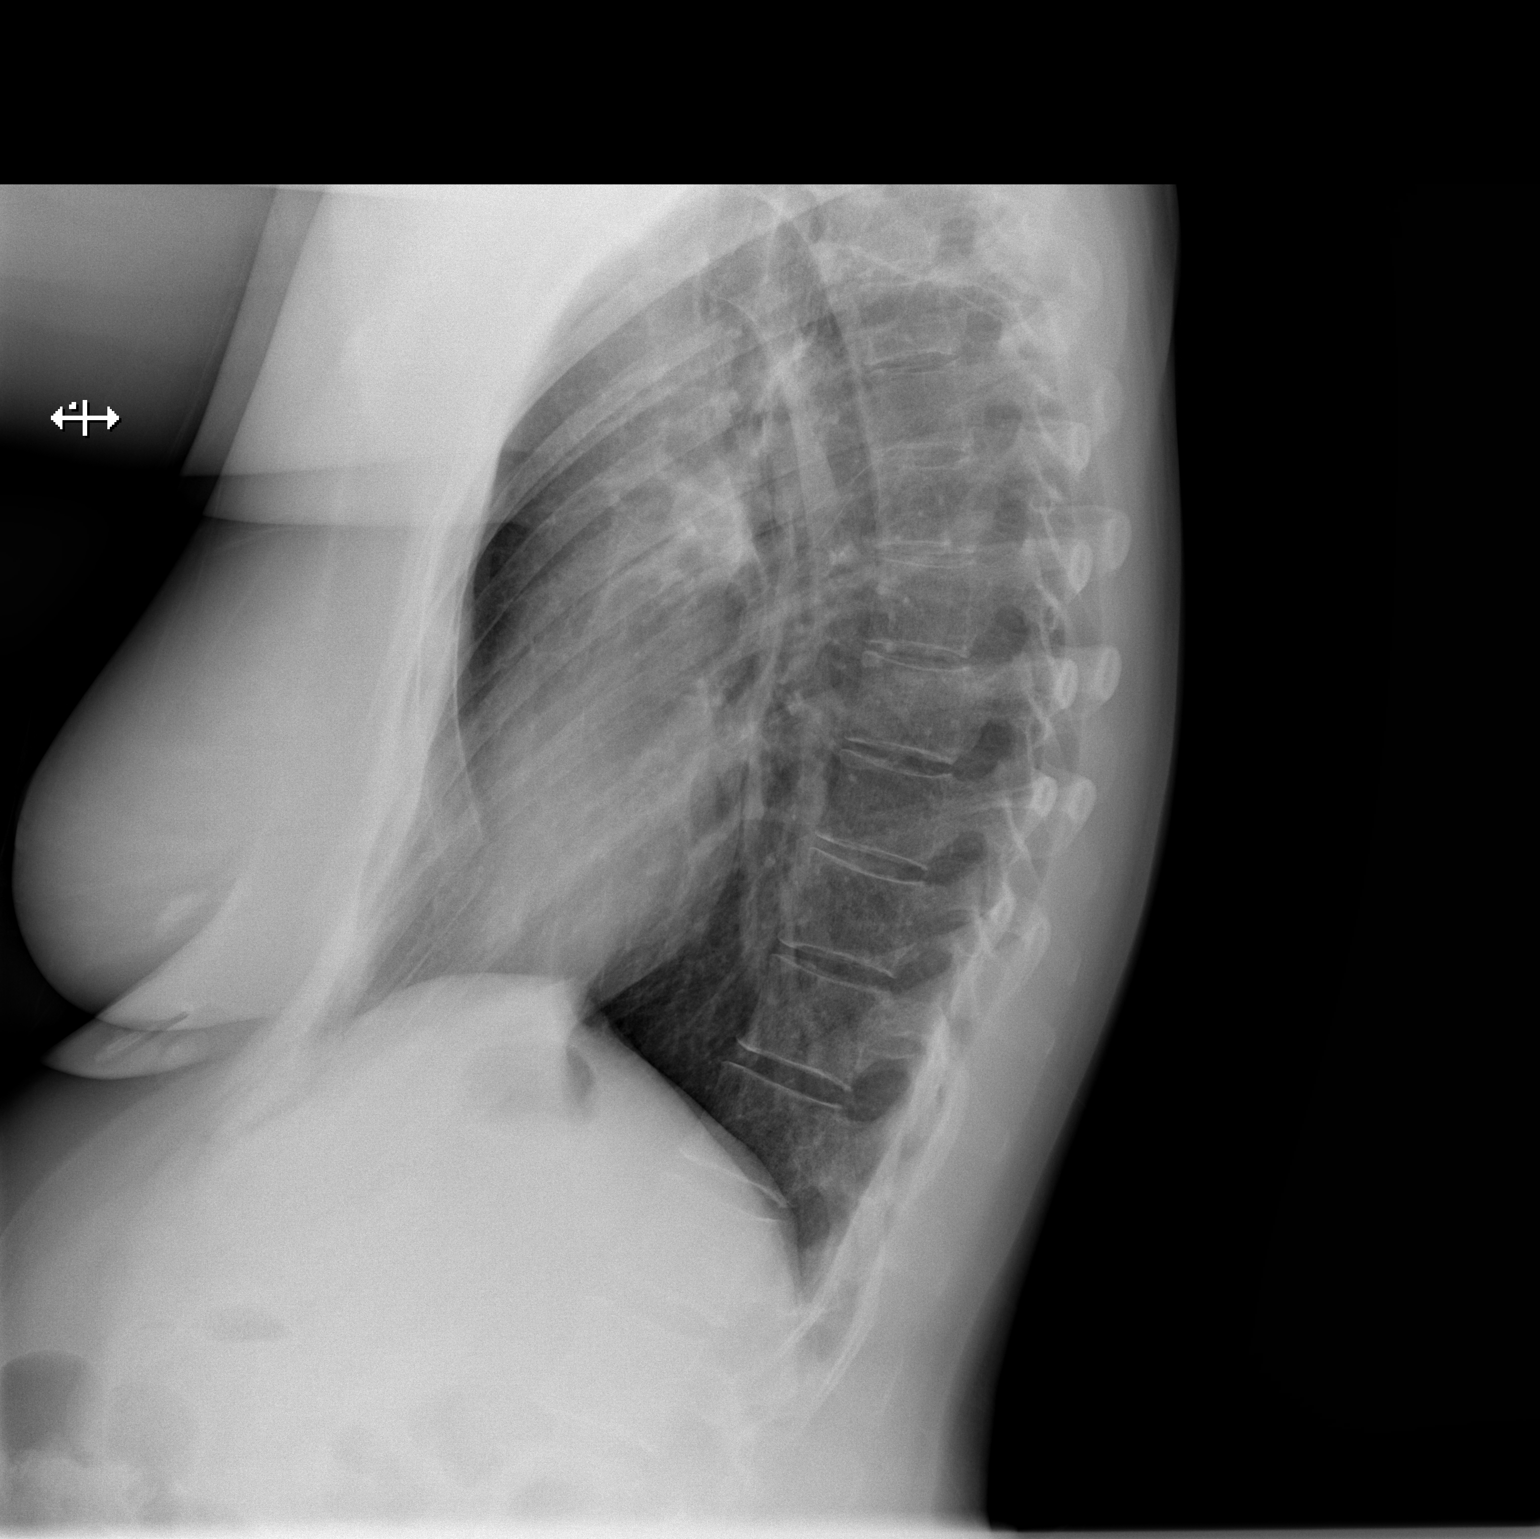

[2 of 2 positions shown; findings below may reference images not displayed]

FINDINGS: The heart size and mediastinal contours are within normal limits.
Both lungs are clear. The visualized skeletal structures are
unremarkable.
IMPRESSION: No acute abnormality of the lungs.

## 2023-04-04 ENCOUNTER — Ambulatory Visit: Payer: Medicaid Other | Admitting: Student

## 2023-04-27 ENCOUNTER — Ambulatory Visit: Payer: Medicaid Other | Admitting: Student

## 2023-05-18 ENCOUNTER — Ambulatory Visit: Payer: Medicaid Other | Admitting: Internal Medicine

## 2023-05-30 ENCOUNTER — Ambulatory Visit (HOSPITAL_COMMUNITY): Payer: Medicaid Other

## 2023-06-29 ENCOUNTER — Ambulatory Visit (INDEPENDENT_AMBULATORY_CARE_PROVIDER_SITE_OTHER): Payer: Medicaid Other | Admitting: Nurse Practitioner

## 2023-06-29 ENCOUNTER — Telehealth: Payer: Self-pay | Admitting: Nurse Practitioner

## 2023-06-29 ENCOUNTER — Encounter: Payer: Self-pay | Admitting: Nurse Practitioner

## 2023-06-29 VITALS — BP 106/66 | HR 89 | Temp 97.0°F | Ht 66.0 in | Wt 185.6 lb

## 2023-06-29 DIAGNOSIS — G43809 Other migraine, not intractable, without status migrainosus: Secondary | ICD-10-CM

## 2023-06-29 DIAGNOSIS — Z87898 Personal history of other specified conditions: Secondary | ICD-10-CM | POA: Diagnosis not present

## 2023-06-29 DIAGNOSIS — F419 Anxiety disorder, unspecified: Secondary | ICD-10-CM

## 2023-06-29 DIAGNOSIS — G43909 Migraine, unspecified, not intractable, without status migrainosus: Secondary | ICD-10-CM

## 2023-06-29 MED ORDER — PROMETHAZINE HCL 25 MG PO TABS: ORAL_TABLET | ORAL | 0 refills | Status: DC

## 2023-06-29 MED ORDER — IMIPRAMINE PAMOATE 150 MG PO CAPS: 150.0000 mg | ORAL_CAPSULE | Freq: Every day | ORAL | 3 refills | Status: DC

## 2023-06-29 MED ORDER — VENLAFAXINE HCL ER 150 MG PO CP24: ORAL_CAPSULE | ORAL | 1 refills | Status: DC

## 2023-06-29 MED ORDER — BUSPIRONE HCL 10 MG PO TABS: 10.0000 mg | ORAL_TABLET | Freq: Three times a day (TID) | ORAL | 2 refills | Status: DC

## 2023-06-29 NOTE — Telephone Encounter (Signed)
Please call to let patient know that I refilled her imipramine for 150 mg. She will only take 1 tablet at bedtime for headaches. Thanks.

## 2023-06-29 NOTE — Progress Notes (Signed)
Subjective   Patient ID: Makayla [name], female    DOB: 24-Feb-1981, 42 y.o.   MRN: [id number]  Chief Complaint  Patient presents with   Establish Care    Referring provider: Hilbert Corrigan Chales Abrahams, NP  Makayla [name] is a 42 y.o. female with Past Medical History: No date: Allergy No date: Anxiety No date: Asthma No date: Depression No date: Hyperlipidemia No date: IBS (irritable bowel syndrome) No date: Migraines No date: OCD (obsessive compulsive disorder) No date: TMJ syndrome  HPI: HPI  Allergies  Allergen Reactions   Zoloft [Sertraline Hcl]     Immunization History  Administered Date(s) Administered   Td 09/22/2006    Tobacco History: Social History   Tobacco Use  Smoking Status Never  Smokeless Tobacco Never   Counseling given: Not Answered   Outpatient Encounter Medications as of 06/29/2023  Medication Sig   busPIRone (BUSPAR) 10 MG tablet Take 10 mg by mouth 3 (three) times daily.   cyclobenzaprine (FLEXERIL) 10 MG tablet TAKE ONE-HALF TO ONE (0.5-1) TABLET TWO TO THREE TIMES A DAY AS NEEDED FOR MUSCLE SPASMS   promethazine (PHENERGAN) 25 MG tablet TAKE ONE TABLET BY MOUTH EVERY 4 HOURS AS NEEDED FOR NAUSEA OR HEADACHE   venlafaxine XR (EFFEXOR-XR) 150 MG 24 hr capsule TAKE ONE CAPSULE BY MOUTH DAILY WITH BREAKFAST   ALPRAZolam (XANAX) 0.5 MG tablet TAKE ONE-HALF TO ONE (0.5-1) TABLET BY MOUTH TWO TO THREE TIMES A DAY AS NEEDED FOR ANXIETY. DO NOT TAKE EVERY DAY *MUST LAST LONGER THAN 30 DAYS*. No further refills will be provided from our office as chart is now closed. (Patient not taking: Reported on 06/29/2023)   Ascorbic Acid (VITAMIN C) 1000 MG tablet Take 1,000 mg by mouth 2 (two) times daily. (Patient not taking: Reported on 06/29/2023)   azithromycin (ZITHROMAX) 250 MG tablet Take 2 tabs PO x 1 dose, then 1 tab PO QD x 4 days (Patient not taking: Reported on 06/29/2023)   predniSONE (STERAPRED UNI-PAK 21 TAB) 10 MG (21) TBPK tablet Take by mouth  daily. Take 6 tabs by mouth daily  for 2 days, then 5 tabs for 2 days, then 4 tabs for 2 days, then 3 tabs for 2 days, 2 tabs for 2 days, then 1 tab by mouth daily for 2 days (Patient not taking: Reported on 06/29/2023)   promethazine-dextromethorphan (PROMETHAZINE-DM) 6.25-15 MG/5ML syrup Take 5 mLs by mouth 3 (three) times daily as needed for cough. (Patient not taking: Reported on 06/29/2023)   No facility-administered encounter medications on file as of 06/29/2023.    Review of Systems  Review of Systems   Objective:   BP 106/66   Pulse 89   Temp (!) 97 F (36.1 C)   Ht 5\' 6"  (1.676 m)   Wt 185 lb 9.6 oz (84.2 kg)   SpO2 100%   BMI 29.96 kg/m   Wt Readings from Last 5 Encounters:  06/29/23 185 lb 9.6 oz (84.2 kg)  04/03/21 183 lb (83 kg)  09/16/20 172 lb (78 kg)  08/14/20 172 lb (78 kg)  12/20/19 163 lb 9.6 oz (74.2 kg)     Physical Exam    Assessment & Plan:   There are no diagnoses linked to this encounter.   No follow-ups on file.   Renelda Loma, RMA 06/29/2023

## 2023-06-29 NOTE — Addendum Note (Signed)
Addended by: Ivonne Andrew on: 06/29/2023 09:27 PM   Modules accepted: Orders

## 2023-06-29 NOTE — Patient Instructions (Addendum)
1. Other migraine without status migrainosus, not intractable  - Ambulatory referral to Neurology  2. Migraine without status migrainosus, not intractable, unspecified migraine type  - promethazine (PHENERGAN) 25 MG tablet; TAKE ONE TABLET BY MOUTH EVERY 4 HOURS AS NEEDED FOR NAUSEA OR HEADACHE  Dispense: 90 tablet; Refill: 0  3. Anxiety  - venlafaxine XR (EFFEXOR-XR) 150 MG 24 hr capsule; TAKE ONE CAPSULE BY MOUTH DAILY WITH BREAKFAST  Dispense: 90 capsule; Refill: 1    Follow up:  Follow up in 3 months

## 2023-06-29 NOTE — Progress Notes (Signed)
Subjective   Patient ID: Makayla [name], female    DOB: Feb 13, 1981, 42 y.o.   MRN: [id number]  Chief Complaint  Patient presents with   Establish Care    Referring provider: Hilbert Corrigan Chales Abrahams, NP  Makayla [name] is a 42 y.o. female with Past Medical History: No date: Allergy No date: Anxiety No date: Asthma No date: Depression No date: Hyperlipidemia No date: IBS (irritable bowel syndrome) No date: Migraines No date: OCD (obsessive compulsive disorder) No date: TMJ syndrome   HPI  Patient presents today to establish care.  It appears that she has been seen in several different practices over the past couple years.  She has been dismissed from one-to-one practice for no-shows and cancellations.  Patient does currently take Tofranil for a history of migraines.  She also states that she has a history of seizures.  We will refill medication today and place a referral for neurology for patient to be followed with.  Patient states that she does have an upcoming appointment with psychiatry.  She is currently on BuSpar and Effexor.  We will refill these today.     Allergies  Allergen Reactions   Zoloft [Sertraline Hcl]     Immunization History  Administered Date(s) Administered   Td 09/22/2006    Tobacco History: Social History   Tobacco Use  Smoking Status Never  Smokeless Tobacco Never   Counseling given: Not Answered   Outpatient Encounter Medications as of 06/29/2023  Medication Sig   cyclobenzaprine (FLEXERIL) 10 MG tablet TAKE ONE-HALF TO ONE (0.5-1) TABLET TWO TO THREE TIMES A DAY AS NEEDED FOR MUSCLE SPASMS   imipramine (TOFRANIL-PM) 150 MG capsule Take 1 capsule (150 mg total) by mouth at bedtime.   [DISCONTINUED] busPIRone (BUSPAR) 10 MG tablet Take 10 mg by mouth 3 (three) times daily.   [DISCONTINUED] promethazine (PHENERGAN) 25 MG tablet TAKE ONE TABLET BY MOUTH EVERY 4 HOURS AS NEEDED FOR NAUSEA OR HEADACHE   [DISCONTINUED] venlafaxine XR  (EFFEXOR-XR) 150 MG 24 hr capsule TAKE ONE CAPSULE BY MOUTH DAILY WITH BREAKFAST   ALPRAZolam (XANAX) 0.5 MG tablet TAKE ONE-HALF TO ONE (0.5-1) TABLET BY MOUTH TWO TO THREE TIMES A DAY AS NEEDED FOR ANXIETY. DO NOT TAKE EVERY DAY *MUST LAST LONGER THAN 30 DAYS*. No further refills will be provided from our office as chart is now closed. (Patient not taking: Reported on 06/29/2023)   Ascorbic Acid (VITAMIN C) 1000 MG tablet Take 1,000 mg by mouth 2 (two) times daily. (Patient not taking: Reported on 06/29/2023)   azithromycin (ZITHROMAX) 250 MG tablet Take 2 tabs PO x 1 dose, then 1 tab PO QD x 4 days (Patient not taking: Reported on 06/29/2023)   busPIRone (BUSPAR) 10 MG tablet Take 1 tablet (10 mg total) by mouth 3 (three) times daily.   predniSONE (STERAPRED UNI-PAK 21 TAB) 10 MG (21) TBPK tablet Take by mouth daily. Take 6 tabs by mouth daily  for 2 days, then 5 tabs for 2 days, then 4 tabs for 2 days, then 3 tabs for 2 days, 2 tabs for 2 days, then 1 tab by mouth daily for 2 days (Patient not taking: Reported on 06/29/2023)   promethazine (PHENERGAN) 25 MG tablet TAKE ONE TABLET BY MOUTH EVERY 4 HOURS AS NEEDED FOR NAUSEA OR HEADACHE   promethazine-dextromethorphan (PROMETHAZINE-DM) 6.25-15 MG/5ML syrup Take 5 mLs by mouth 3 (three) times daily as needed for cough. (Patient not taking: Reported on 06/29/2023)   venlafaxine XR (EFFEXOR-XR) 150  MG 24 hr capsule TAKE ONE CAPSULE BY MOUTH DAILY WITH BREAKFAST   No facility-administered encounter medications on file as of 06/29/2023.    Review of Systems  Review of Systems  Constitutional: Negative.   HENT: Negative.    Cardiovascular: Negative.   Gastrointestinal: Negative.   Allergic/Immunologic: Negative.   Neurological: Negative.   Psychiatric/Behavioral: Negative.       Objective:   BP 106/66   Pulse 89   Temp (!) 97 F (36.1 C)   Ht 5\' 6"  (1.676 m)   Wt 185 lb 9.6 oz (84.2 kg)   SpO2 100%   BMI 29.96 kg/m   Wt Readings from Last  5 Encounters:  06/29/23 185 lb 9.6 oz (84.2 kg)  04/03/21 183 lb (83 kg)  09/16/20 172 lb (78 kg)  08/14/20 172 lb (78 kg)  12/20/19 163 lb 9.6 oz (74.2 kg)     Physical Exam Vitals and nursing note reviewed.  Constitutional:      General: She is not in acute distress.    Appearance: She is well-developed.  Cardiovascular:     Rate and Rhythm: Normal rate and regular rhythm.  Pulmonary:     Effort: Pulmonary effort is normal.     Breath sounds: Normal breath sounds.  Neurological:     Mental Status: She is alert and oriented to person, place, and time.       Assessment & Plan:   Other migraine without status migrainosus, not intractable -     Ambulatory referral to Neurology  Migraine without status migrainosus, not intractable, unspecified migraine type -     Promethazine HCl; TAKE ONE TABLET BY MOUTH EVERY 4 HOURS AS NEEDED FOR NAUSEA OR HEADACHE  Dispense: 90 tablet; Refill: 0 -     CBC -     Comprehensive metabolic panel  Anxiety -     Venlafaxine HCl ER; TAKE ONE CAPSULE BY MOUTH DAILY WITH BREAKFAST  Dispense: 90 capsule; Refill: 1  History of seizure -     Ambulatory referral to Neurology  Other orders -     busPIRone HCl; Take 1 tablet (10 mg total) by mouth 3 (three) times daily.  Dispense: 60 tablet; Refill: 2 -     Imipramine Pamoate; Take 1 capsule (150 mg total) by mouth at bedtime.  Dispense: 90 capsule; Refill: 3     Return in about 3 months (around 09/28/2023).   Makayla Andrew, NP 06/29/2023

## 2023-06-30 ENCOUNTER — Other Ambulatory Visit: Payer: Self-pay | Admitting: Nurse Practitioner

## 2023-06-30 ENCOUNTER — Telehealth: Payer: Self-pay | Admitting: Nurse Practitioner

## 2023-06-30 LAB — COMPREHENSIVE METABOLIC PANEL
ALT: 11 IU/L (ref 0–32)
AST: 14 IU/L (ref 0–40)
Albumin: 4.3 g/dL (ref 3.9–4.9)
Alkaline Phosphatase: 94 IU/L (ref 44–121)
BUN/Creatinine Ratio: 7 — ABNORMAL LOW (ref 9–23)
BUN: 5 mg/dL — ABNORMAL LOW (ref 6–24)
Bilirubin Total: 0.3 mg/dL (ref 0.0–1.2)
CO2: 22 mmol/L (ref 20–29)
Calcium: 8.9 mg/dL (ref 8.7–10.2)
Chloride: 101 mmol/L (ref 96–106)
Creatinine, Ser: 0.69 mg/dL (ref 0.57–1.00)
Globulin, Total: 2.3 g/dL (ref 1.5–4.5)
Glucose: 84 mg/dL (ref 70–99)
Potassium: 4.1 mmol/L (ref 3.5–5.2)
Sodium: 139 mmol/L (ref 134–144)
Total Protein: 6.6 g/dL (ref 6.0–8.5)
eGFR: 110 mL/min/{1.73_m2} (ref >59)

## 2023-06-30 LAB — CBC
Hematocrit: 41.5 % (ref 34.0–46.6)
Hemoglobin: 13.3 g/dL (ref 11.1–15.9)
MCH: 30.2 pg (ref 26.6–33.0)
MCHC: 32 g/dL (ref 31.5–35.7)
MCV: 94 fL (ref 79–97)
Platelets: 239 10*3/uL (ref 150–450)
RBC: 4.4 x10E6/uL (ref 3.77–5.28)
RDW: 11.7 % (ref 11.7–15.4)
WBC: 7.2 10*3/uL (ref 3.4–10.8)

## 2023-06-30 MED ORDER — IMIPRAMINE HCL 50 MG PO TABS: 50.0000 mg | ORAL_TABLET | Freq: Three times a day (TID) | ORAL | 1 refills | Status: DC

## 2023-06-30 NOTE — Telephone Encounter (Signed)
Please call to let patient know that we just spoke to the pharmacy and they were unable to get the imipramine 150 mg tablets so we are going to send in the 50 mg tablets to take 3 a day as needed for headaches. As previously prescribed by previous PCP.

## 2023-07-02 NOTE — Telephone Encounter (Signed)
Lvm to advise Renelda Loma RMA

## 2023-07-13 ENCOUNTER — Encounter: Payer: Self-pay | Admitting: Neurology

## 2023-07-14 ENCOUNTER — Ambulatory Visit (HOSPITAL_COMMUNITY)
Admission: EM | Admit: 2023-07-14 | Discharge: 2023-07-14 | Disposition: A | Discharge status: Home / Self Care | Payer: Medicaid Other | Attending: Family Medicine | Admitting: Family Medicine

## 2023-07-14 ENCOUNTER — Ambulatory Visit (HOSPITAL_COMMUNITY): Payer: Self-pay

## 2023-07-14 ENCOUNTER — Encounter (HOSPITAL_COMMUNITY): Payer: Self-pay | Admitting: Emergency Medicine

## 2023-07-14 DIAGNOSIS — M25561 Pain in right knee: Secondary | ICD-10-CM | POA: Diagnosis not present

## 2023-07-14 MED ORDER — NAPROXEN 500 MG PO TBEC: 500.0000 mg | DELAYED_RELEASE_TABLET | Freq: Two times a day (BID) | ORAL | 0 refills | Status: DC

## 2023-07-14 MED ORDER — PREDNISONE 20 MG PO TABS: 40.0000 mg | ORAL_TABLET | Freq: Every day | ORAL | 0 refills | Status: DC

## 2023-07-14 NOTE — ED Triage Notes (Signed)
Pt presents to urgent care today with throbbing right knee pain. Pt reports she last took Tylenol extra strength last night around 2100. Denies injury. Pt rates her pain as a 3/10, ambulatory to room.

## 2023-07-14 NOTE — ED Provider Notes (Signed)
[facility]   [id number] 07/14/23 Arrival Time: 1025  ASSESSMENT & PLAN:  1. Acute pain of right knee    Begin: New Prescriptions   NAPROXEN (EC-NAPROXEN) 500 MG EC TABLET    Take 1 tablet (500 mg total) by mouth 2 (two) times daily with a meal.   PREDNISONE (DELTASONE) 20 MG TABLET    Take 2 tablets (40 mg total) by mouth daily.   Activities as tolerated.  Recommend:  Follow-up Information     Schedule an appointment as soon as possible for a visit  with [facility].   Contact information: [address] [place] [address] [phone]                Reviewed expectations re: course of current medical issues. Questions answered. Outlined signs and symptoms indicating need for more acute intervention. Patient verbalized understanding. After Visit Summary given.  SUBJECTIVE: History from: patient. Makayla [name] is a 42 y.o. female who presents with right knee pain for two weeks. She denies any injury, inciting incident, or prior history of symptoms. Patient reports that pain is a 3/10 severity and feels like a constant throbbing. She reports that pain is worse with bending knee and ambulation and is improved when knee is not in use. She has taken extra-strength tylenol off and on for symptoms with minimal relief. She denies any numbness or tingling, changes in sensation, swelling, or bruising.   Past Surgical History:  Procedure Laterality Date   BIOPSY BREAST Bilateral    TONSILLECTOMY Bilateral    TONSILLECTOMY AND ADENOIDECTOMY        OBJECTIVE:  Vitals:   07/14/23 1052  BP: (!) 140/81  Pulse: 86  Resp: 16  Temp: 98.5 F (36.9 C)  TempSrc: Oral  SpO2: 99%    General appearance: alert; no distress HEENT: [place]; AT Neck: supple with FROM Resp: unlabored respirations Extremities: RLE: warm with well perfused appearance; fairly well localized mild tenderness over right superior lateral  knee; without gross deformities; swelling: none; bruising: none; knee ROM: normal CV: brisk extremity capillary refill of RLE; 2+ DP pulse of RLE. Skin: warm and dry; no visible rashes Neurologic: gait normal; normal sensation and strength of RLE Psychological: alert and cooperative; normal mood and affect   Allergies  Allergen Reactions   Zoloft [Sertraline Hcl]     Past Medical History:  Diagnosis Date   Allergy    Anxiety    Asthma    Depression    Hyperlipidemia    IBS (irritable bowel syndrome)    Migraines    OCD (obsessive compulsive disorder)    TMJ syndrome    Social History   Socioeconomic History   Marital status: Single    Spouse name: Not on file   Number of children: Not on file   Years of education: Not on file   Highest education level: Not on file  Occupational History   Not on file  Tobacco Use   Smoking status: Never   Smokeless tobacco: Never  Vaping Use   Vaping status: Never Used  Substance and Sexual Activity   Alcohol use: No   Drug use: No   Sexual activity: Not on file  Other Topics Concern   Not on file  Social History Narrative   Not on file   Social Determinants of Health   Financial Resource Strain: Not on file  Food Insecurity: No Food Insecurity (12/29/2021)   Received from [facility]   Hunger Vital Sign    Worried About Running Out of Food in the Last Year: Never true    Ran Out of Food in the Last Year: Never true  Transportation Needs: Not on file  Physical Activity: Not on file  Stress: Not on file  Social Connections: Unknown (02/02/2022)   Received from [facility], [facility]   Social Network    Social Network: Not on file   History reviewed. No pertinent family history. Past Surgical History:  Procedure Laterality Date   BIOPSY BREAST Bilateral    TONSILLECTOMY Bilateral    TONSILLECTOMY AND ADENOIDECTOMY         Mardella Layman, MD 07/14/23 1227

## 2023-07-22 ENCOUNTER — Ambulatory Visit: Payer: Self-pay | Admitting: Nurse Practitioner

## 2023-07-29 ENCOUNTER — Ambulatory Visit (INDEPENDENT_AMBULATORY_CARE_PROVIDER_SITE_OTHER): Payer: Medicaid Other | Admitting: Family Medicine

## 2023-07-29 VITALS — BP 112/78 | HR 85 | Temp 97.9°F | Ht 66.75 in | Wt 188.8 lb

## 2023-07-29 DIAGNOSIS — G44219 Episodic tension-type headache, not intractable: Secondary | ICD-10-CM | POA: Diagnosis not present

## 2023-07-29 DIAGNOSIS — F429 Obsessive-compulsive disorder, unspecified: Secondary | ICD-10-CM

## 2023-07-29 DIAGNOSIS — Z87898 Personal history of other specified conditions: Secondary | ICD-10-CM | POA: Diagnosis not present

## 2023-07-29 DIAGNOSIS — F419 Anxiety disorder, unspecified: Secondary | ICD-10-CM

## 2023-07-29 DIAGNOSIS — R232 Flushing: Secondary | ICD-10-CM

## 2023-07-29 DIAGNOSIS — R350 Frequency of micturition: Secondary | ICD-10-CM

## 2023-07-29 DIAGNOSIS — G43809 Other migraine, not intractable, without status migrainosus: Secondary | ICD-10-CM

## 2023-07-29 DIAGNOSIS — F32A Depression, unspecified: Secondary | ICD-10-CM

## 2023-07-29 LAB — CBC WITH DIFFERENTIAL/PLATELET
Basophils Absolute: 0 10*3/uL (ref 0.0–0.1)
Basophils Relative: 0.7 % (ref 0.0–3.0)
Eosinophils Absolute: 0.2 10*3/uL (ref 0.0–0.7)
Eosinophils Relative: 3 % (ref 0.0–5.0)
HCT: 37.5 % (ref 36.0–46.0)
Hemoglobin: 12.3 g/dL (ref 12.0–15.0)
Lymphocytes Relative: 22.2 % (ref 12.0–46.0)
Lymphs Abs: 1.4 10*3/uL (ref 0.7–4.0)
MCHC: 32.8 g/dL (ref 30.0–36.0)
MCV: 91.5 fL (ref 78.0–100.0)
Monocytes Absolute: 0.6 10*3/uL (ref 0.1–1.0)
Monocytes Relative: 8.9 % (ref 3.0–12.0)
Neutro Abs: 4.2 10*3/uL (ref 1.4–7.7)
Neutrophils Relative %: 65.2 % (ref 43.0–77.0)
Platelets: 234 10*3/uL (ref 150.0–400.0)
RBC: 4.1 Mil/uL (ref 3.87–5.11)
RDW: 12.5 % (ref 11.5–15.5)
WBC: 6.5 10*3/uL (ref 4.0–10.5)

## 2023-07-29 LAB — COMPREHENSIVE METABOLIC PANEL
ALT: 12 U/L (ref 0–35)
AST: 13 U/L (ref 0–37)
Albumin: 4 g/dL (ref 3.5–5.2)
Alkaline Phosphatase: 74 U/L (ref 39–117)
BUN: 8 mg/dL (ref 6–23)
CO2: 31 meq/L (ref 19–32)
Calcium: 8.6 mg/dL (ref 8.4–10.5)
Chloride: 103 meq/L (ref 96–112)
Creatinine, Ser: 0.68 mg/dL (ref 0.40–1.20)
GFR: 106.47 mL/min (ref >60.00)
Glucose, Bld: 95 mg/dL (ref 70–99)
Potassium: 4.3 meq/L (ref 3.5–5.1)
Sodium: 139 meq/L (ref 135–145)
Total Bilirubin: 0.3 mg/dL (ref 0.2–1.2)
Total Protein: 6.6 g/dL (ref 6.0–8.3)

## 2023-07-29 LAB — T4, FREE: Free T4: 0.74 ng/dL (ref 0.60–1.60)

## 2023-07-29 LAB — TSH: TSH: 3.31 u[IU]/mL (ref 0.35–5.50)

## 2023-07-29 MED ORDER — CYCLOBENZAPRINE HCL 10 MG PO TABS: ORAL_TABLET | ORAL | 0 refills | Status: DC

## 2023-07-29 NOTE — Progress Notes (Unsigned)
New Patient Office Visit  Subjective    Patient ID: Makayla [name], female    DOB: 04-11-1981  Age: 42 y.o. MRN: [id number]  CC:  Chief Complaint  Patient presents with   Establish Care    Establish Care. Concerns about medication as well as UTI like symptoms. Increased frequency in urinating. Noted of past labs with another provider that showed abnormal kidney function (Labs 06/29/23 in chart)    HPI Makayla [name] presents to establish care She has reportedly seen several different PCPs in the past few months.   [facility] - for 20 years. States she was dismissed for missing some appointments. She had to establish with a new practice.   Hx of seizures. Last one in 2022/08/18.   Sep 18, 2023 - has neurology appt   Reports taking Buspar and Effexor for anxiety.  She also c/o hot flashes   Hx of mood swings that have improved.   Took Zoloft in HS   States she was taking alprazolam but had to stop cold [place] in March 2023 and had withdrawal symptoms. States she has not been on it since.   States she is on waiting list with [facility] for anxiety and depression and OCD Dr. Evelene Croon in past   Worked up for chest pain in past.    LMP: 2 weeks ago.  Fairly regular cycles.   Headaches- stress induced  Takes flexeril and imipramine  Appt with neurologist in 18-Sep-2023   Mother passed away in August 18, 2017.   Works as Conservation officer, nature at [employer].  No kids  Single      06/29/2023    9:17 AM  Depression screen PHQ 2/9  Decreased Interest 3  Down, Depressed, Hopeless 3  PHQ - 2 Score 6  Altered sleeping 3  Tired, decreased energy 3  Change in appetite 0  Feeling bad or failure about yourself  3  Trouble concentrating 0  Moving slowly or fidgety/restless 0  Suicidal thoughts 1  PHQ-9 Score 16  Difficult doing work/chores Extremely dIfficult     Outpatient Encounter Medications as of 07/29/2023  Medication Sig   imipramine (TOFRANIL) 50 MG tablet Take 1 tablet (50 mg total)  by mouth 3 (three) times daily.   naproxen (EC-NAPROXEN) 500 MG EC tablet Take 1 tablet (500 mg total) by mouth 2 (two) times daily with a meal.   predniSONE (DELTASONE) 20 MG tablet Take 2 tablets (40 mg total) by mouth daily.   promethazine (PHENERGAN) 25 MG tablet TAKE ONE TABLET BY MOUTH EVERY 4 HOURS AS NEEDED FOR NAUSEA OR HEADACHE   venlafaxine XR (EFFEXOR-XR) 150 MG 24 hr capsule TAKE ONE CAPSULE BY MOUTH DAILY WITH BREAKFAST   [DISCONTINUED] cyclobenzaprine (FLEXERIL) 10 MG tablet TAKE ONE-HALF TO ONE (0.5-1) TABLET TWO TO THREE TIMES A DAY AS NEEDED FOR MUSCLE SPASMS   busPIRone (BUSPAR) 10 MG tablet Take 1 tablet (10 mg total) by mouth 3 (three) times daily.   cyclobenzaprine (FLEXERIL) 10 MG tablet TAKE ONE-HALF TO ONE (0.5-1) TABLET TWO TO THREE TIMES A DAY AS NEEDED FOR MUSCLE SPASMS   [DISCONTINUED] ALPRAZolam (XANAX) 0.5 MG tablet TAKE ONE-HALF TO ONE (0.5-1) TABLET BY MOUTH TWO TO THREE TIMES A DAY AS NEEDED FOR ANXIETY. DO NOT TAKE EVERY DAY *MUST LAST LONGER THAN 30 DAYS*. No further refills will be provided from our office as chart is now closed. (Patient not taking: Reported on 06/29/2023)   [DISCONTINUED] Ascorbic Acid (VITAMIN C) 1000 MG tablet Take 1,000 mg by mouth  2 (two) times daily. (Patient not taking: Reported on 06/29/2023)   [DISCONTINUED] azithromycin (ZITHROMAX) 250 MG tablet Take 2 tabs PO x 1 dose, then 1 tab PO QD x 4 days (Patient not taking: Reported on 06/29/2023)   [DISCONTINUED] promethazine-dextromethorphan (PROMETHAZINE-DM) 6.25-15 MG/5ML syrup Take 5 mLs by mouth 3 (three) times daily as needed for cough. (Patient not taking: Reported on 06/29/2023)   No facility-administered encounter medications on file as of 07/29/2023.    Past Medical History:  Diagnosis Date   Allergy    Anxiety    Asthma    Depression    Hyperlipidemia    IBS (irritable bowel syndrome)    Migraines    OCD (obsessive compulsive disorder)    TMJ syndrome     Past Surgical  History:  Procedure Laterality Date   BIOPSY BREAST Bilateral    TONSILLECTOMY Bilateral    TONSILLECTOMY AND ADENOIDECTOMY      History reviewed. No pertinent family history.  Social History   Socioeconomic History   Marital status: Single    Spouse name: Not on file   Number of children: Not on file   Years of education: Not on file   Highest education level: Bachelor's degree (e.g., BA, AB, BS)  Occupational History   Not on file  Tobacco Use   Smoking status: Never   Smokeless tobacco: Never  Vaping Use   Vaping status: Never Used  Substance and Sexual Activity   Alcohol use: No   Drug use: No   Sexual activity: Not on file  Other Topics Concern   Not on file  Social History Narrative   Not on file   Social Determinants of Health   Financial Resource Strain: High Risk (07/29/2023)   Overall Financial Resource Strain (CARDIA)    Difficulty of Paying Living Expenses: Very hard  Food Insecurity: Food Insecurity Present (07/29/2023)   Hunger Vital Sign    Worried About Running Out of Food in the Last Year: Sometimes true    Ran Out of Food in the Last Year: Never true  Transportation Needs: Unmet Transportation Needs (07/29/2023)   PRAPARE - Administrator, Civil Service (Medical): Yes    Lack of Transportation (Non-Medical): No  Physical Activity: Sufficiently Active (07/29/2023)   Exercise Vital Sign    Days of Exercise per Week: 2 days    Minutes of Exercise per Session: 150+ min  Stress: Stress Concern Present (07/29/2023)   [employer] of Occupational Health - Occupational Stress Questionnaire    Feeling of Stress : Rather much  Social Connections: Socially Isolated (07/29/2023)   Social Connection and Isolation Panel [NHANES]    Frequency of Communication with Friends and Family: Once a week    Frequency of Social Gatherings with Friends and Family: Never    Attends Religious Services: Never    Database administrator or Organizations:  No    Attends Engineer, structural: Not on file    Marital Status: Never married  Intimate Partner Violence: Unknown (01/04/2022)   Received from [facility], [facility]   HITS    Physically Hurt: Not on file    Insult or Talk Down To: Not on file    Threaten Physical Harm: Not on file    Scream or Curse: Not on file    Review of Systems  Constitutional:  Negative for chills and fever.  Respiratory:  Negative for shortness of breath.   Cardiovascular:  Negative for chest pain, palpitations  and leg swelling.  Gastrointestinal:  Positive for nausea. Negative for abdominal pain, constipation, diarrhea and vomiting.  Genitourinary:  Positive for frequency. Negative for dysuria and urgency.  Neurological:  Negative for dizziness.  Psychiatric/Behavioral:  Positive for depression. Negative for substance abuse. The patient is nervous/anxious.         Objective    BP 112/78   Pulse 85   Temp 97.9 F (36.6 C) (Oral)   Ht 5' 6.75" (1.695 m)   Wt 188 lb 12.8 oz (85.6 kg)   LMP 07/07/2023 (Approximate)   SpO2 96%   BMI 29.79 kg/m   Physical Exam Constitutional:      General: She is not in acute distress.    Appearance: She is not ill-appearing.  Eyes:     Extraocular Movements: Extraocular movements intact.     Conjunctiva/sclera: Conjunctivae normal.  Cardiovascular:     Rate and Rhythm: Normal rate.  Pulmonary:     Effort: Pulmonary effort is normal.  Musculoskeletal:     Cervical back: Normal range of motion and neck supple.  Skin:    General: Skin is warm and dry.  Neurological:     General: No focal deficit present.     Mental Status: She is alert and oriented to person, place, and time.  Psychiatric:        Mood and Affect: Mood normal.        Behavior: Behavior normal.        Thought Content: Thought content normal.         Assessment & Plan:   Problem List Items Addressed This Visit       Cardiovascular and Mediastinum   Migraines    Relevant Medications   cyclobenzaprine (FLEXERIL) 10 MG tablet     Other   OCD (obsessive compulsive disorder)   Tension-type headache   Relevant Medications   cyclobenzaprine (FLEXERIL) 10 MG tablet   Other Visit Diagnoses     Hot flashes    -  Primary   Relevant Orders   CBC with Differential/Platelet (Completed)   Comprehensive metabolic panel (Completed)   TSH (Completed)   T4, free (Completed)   Anxiety and depression       Urinary frequency       Relevant Orders   Urine Culture   Urinalysis, Routine w reflex microscopic   History of seizure          She is here to establish care.  Mental health history and has seen different providers and tried various medications for anxiety, depression and OCD. Upcoming appt with [facility], on waiting list.  Continue Buspar and Effexor for now.  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  Check thyroid function for hot flashes. Consider referral to gynecology or endocrinology if she is willing. Urinary frequency- check UA She will see neurologist in December for migraines and hx of seizures.  Follow up here in 4 weeks or pending labs.   Return in about 4 weeks (around 08/26/2023) for chronic health conditions.   Hetty Blend, NP-C

## 2023-07-29 NOTE — Patient Instructions (Signed)
Please go downstairs for labs and I will be in touch with your results.   I refilled your muscle relaxant.   Follow up here in 4 weeks.

## 2023-08-03 ENCOUNTER — Encounter: Payer: Self-pay | Admitting: Family Medicine

## 2023-08-03 NOTE — Telephone Encounter (Signed)
Please advise 

## 2023-08-22 ENCOUNTER — Ambulatory Visit (HOSPITAL_COMMUNITY)
Admission: RE | Admit: 2023-08-22 | Discharge: 2023-08-22 | Disposition: A | Discharge status: Home / Self Care | Payer: Medicaid Other | Source: Ambulatory Visit | Attending: Internal Medicine | Admitting: Internal Medicine

## 2023-08-22 ENCOUNTER — Encounter (HOSPITAL_COMMUNITY): Payer: Self-pay

## 2023-08-22 VITALS — BP 129/81 | HR 95 | Temp 97.9°F | Resp 16 | Ht 67.0 in | Wt 180.0 lb

## 2023-08-22 DIAGNOSIS — R5383 Other fatigue: Secondary | ICD-10-CM | POA: Insufficient documentation

## 2023-08-22 LAB — POCT URINALYSIS DIP (MANUAL ENTRY)
Bilirubin, UA: NEGATIVE
Blood, UA: NEGATIVE
Glucose, UA: NEGATIVE mg/dL
Ketones, POC UA: NEGATIVE mg/dL
Leukocytes, UA: NEGATIVE
Nitrite, UA: NEGATIVE
Spec Grav, UA: >=1.03 — AB (ref 1.010–1.025)
Urobilinogen, UA: 1 U/dL
pH, UA: 6.5 (ref 5.0–8.0)

## 2023-08-22 LAB — POCT URINE PREGNANCY: Preg Test, Ur: NEGATIVE

## 2023-08-22 LAB — VITAMIN D 25 HYDROXY (VIT D DEFICIENCY, FRACTURES): Vit D, 25-Hydroxy: 13.78 ng/mL — ABNORMAL LOW (ref 30–100)

## 2023-08-22 NOTE — ED Notes (Signed)
This RN attempted blood draw in right hand, unsuccessful. Tolerated well by pt.

## 2023-08-22 NOTE — Discharge Instructions (Addendum)
Please maintain adequate hydration Your urine is negative for urinary tract infection We checked your vitamin D level today Will call you with recommendations if labs are abnormal Please feel free to return to urgent care if you have worsening symptoms Follow-up with your primary care physician as scheduled.

## 2023-08-22 NOTE — ED Triage Notes (Signed)
Pt presents with urinary frequency & blood in urine x "more than 3 weeks." Pt denies burning with urination. Pt does endorse fevers. Pt cannot remember LMP, states "it was sometime in September." Pt denies being sexually active.

## 2023-08-24 NOTE — ED Provider Notes (Addendum)
[facility]    CSN: [id number] Arrival date & time: 08/22/23  1041      History   Chief Complaint Chief Complaint  Patient presents with   Urinary Frequency    Entered by patient    HPI Makayla [name] is a 42 y.o. female comes to the urgent care with 3 weeks history of urinary frequency and a concern for blood in the urine.  Patient denies any flank pain, fever, chills, dysuria, urgency.  Patient also complains of fatigue.  No recent URI symptoms.  Patient denies any vaginal discharge.  She is not sexually active Patient has low mood and anhedonia over the past several weeks.  She denies crying.  No suicidal or homicidal ideation.  She has a history of anxiety previously managed with Xanax.  She is currently on BuSpar.  She endorses that her fatigue, anhedonia and low mood started after she switched medications from Xanax to BuSpar..   HPI  Past Medical History:  Diagnosis Date   Allergy    Anxiety    Asthma    Depression    Hyperlipidemia    IBS (irritable bowel syndrome)    Migraines    OCD (obsessive compulsive disorder)    TMJ syndrome     Patient Active Problem List   Diagnosis Date Noted   Tension-type headache 04/03/2021   Medication management 02/24/2016   Asthma    IBS (irritable bowel syndrome)    Migraines    TMJ syndrome    Depression    Anxiety    OCD (obsessive compulsive disorder)     Past Surgical History:  Procedure Laterality Date   BIOPSY BREAST Bilateral    TONSILLECTOMY Bilateral    TONSILLECTOMY AND ADENOIDECTOMY      OB History   No obstetric history on file.      Home Medications    Prior to Admission medications   Medication Sig Start Date End Date Taking? Authorizing Provider  busPIRone (BUSPAR) 10 MG tablet Take 1 tablet (10 mg total) by mouth 3 (three) times daily. 06/29/23   Ivonne Andrew, NP  imipramine (TOFRANIL) 50 MG tablet Take 1 tablet (50 mg total) by mouth 3 (three) times daily. 06/30/23   Ivonne Andrew, NP  promethazine (PHENERGAN) 25 MG tablet TAKE ONE TABLET BY MOUTH EVERY 4 HOURS AS NEEDED FOR NAUSEA OR HEADACHE Patient taking differently: once as needed. TAKE ONE TABLET BY MOUTH EVERY 4 HOURS AS NEEDED FOR NAUSEA OR HEADACHE 06/29/23   Ivonne Andrew, NP  venlafaxine XR (EFFEXOR-XR) 150 MG 24 hr capsule TAKE ONE CAPSULE BY MOUTH DAILY WITH BREAKFAST 06/29/23   Ivonne Andrew, NP    Family History History reviewed. No pertinent family history.  Social History Social History   Tobacco Use   Smoking status: Never   Smokeless tobacco: Never  Vaping Use   Vaping status: Never Used  Substance Use Topics   Alcohol use: No   Drug use: No     Allergies   Zoloft [sertraline hcl]   Review of Systems Review of Systems As per HPI  Physical Exam Triage Vital Signs ED Triage Vitals  Encounter Vitals Group     BP 08/22/23 1130 129/81     Systolic BP Percentile --      Diastolic BP Percentile --      Pulse Rate 08/22/23 1130 95     Resp 08/22/23 1130 16     Temp 08/22/23 1130 97.9 F (36.6  C)     Temp Source 08/22/23 1130 Oral     SpO2 08/22/23 1130 99 %     Weight 08/22/23 1133 180 lb (81.6 kg)     Height 08/22/23 1133 5\' 7"  (1.702 m)     Head Circumference --      Peak Flow --      Pain Score 08/22/23 1132 0     Pain Loc --      Pain Education --      Exclude from Growth Chart --    No data found.  Updated Vital Signs BP 129/81 (BP Location: Left Arm)   Pulse 95   Temp 97.9 F (36.6 C) (Oral)   Resp 16   Ht 5\' 7"  (1.702 m)   Wt 81.6 kg   LMP  (Within Months)   SpO2 99%   BMI 28.19 kg/m   Visual Acuity Right Eye Distance:   Left Eye Distance:   Bilateral Distance:    Right Eye Near:   Left Eye Near:    Bilateral Near:     Physical Exam Vitals and nursing note reviewed.  Constitutional:      Appearance: Normal appearance.  HENT:     Right Ear: Tympanic membrane normal.     Left Ear: Tympanic membrane normal.  Cardiovascular:     Rate  and Rhythm: Normal rate and regular rhythm.     Pulses: Normal pulses.     Heart sounds: Normal heart sounds.  Pulmonary:     Effort: Pulmonary effort is normal.     Breath sounds: Normal breath sounds.  Abdominal:     General: Bowel sounds are normal.     Palpations: Abdomen is soft.  Musculoskeletal:        General: Normal range of motion.  Neurological:     Mental Status: She is alert.      UC Treatments / Results  Labs (all labs ordered are listed, but only abnormal results are displayed) Labs Reviewed  VITAMIN D 25 HYDROXY (VIT D DEFICIENCY, FRACTURES) - Abnormal; Notable for the following components:      Result Value   Vit D, 25-Hydroxy 13.78 (*)    All other components within normal limits  POCT URINALYSIS DIP (MANUAL ENTRY) - Abnormal; Notable for the following components:   Clarity, UA cloudy (*)    Spec Grav, UA >=1.030 (*)    Protein Ur, POC trace (*)    All other components within normal limits  POCT URINE PREGNANCY    EKG   Radiology No results found.  Procedures Procedures (including critical care time)  Medications Ordered in UC Medications - No data to display  Initial Impression / Assessment and Plan / UC Course  I have reviewed the triage vital signs and the nursing notes.  Pertinent labs & imaging results that were available during my care of the patient were reviewed by me and considered in my medical decision making (see chart for details).     1.  Fatigue and anhedonia: Vitamin D level-patient has a history of vitamin D deficiency Point-of-care urine is negative for urinary tract infection Patient is advised to increase oral fluid intake Chart review from a unique source was completed during this visit-recent CBC, CMP and TSH were reviewed and they were normal Return precautions given. Final Clinical Impressions(s) / UC Diagnoses   Final diagnoses:  Fatigue, unspecified type     Discharge Instructions      Please maintain  adequate hydration Your urine  is negative for urinary tract infection We checked your vitamin D level today Will call you with recommendations if labs are abnormal Please feel free to return to urgent care if you have worsening symptoms Follow-up with your primary care physician as scheduled.   ED Prescriptions   None    PDMP not reviewed this encounter.   Merrilee Jansky, MD 08/24/23 1514    Merrilee Jansky, MD 08/24/23 [phone]

## 2023-08-31 ENCOUNTER — Telehealth: Payer: Self-pay | Admitting: Family Medicine

## 2023-08-31 ENCOUNTER — Other Ambulatory Visit: Payer: Self-pay | Admitting: Nurse Practitioner

## 2023-08-31 NOTE — Telephone Encounter (Signed)
sent 

## 2023-08-31 NOTE — Telephone Encounter (Signed)
Patient said she has run out of her medication.   Prescription Request  08/31/2023  LOV: 07/29/2023  What is the name of the medication or equipment?  imipramine (TOFRANIL) 50 MG tablet  Have you contacted your pharmacy to request a refill? Yes   Which pharmacy would you like this sent to?  [facility] [id number] [name], [place] - [address] [name] [address] Phone: [phone] Fax: [phone]    Patient notified that their request is being sent to the clinical staff for review and that they should receive a response within 2 business days.   Please advise at Mobile [phone] (mobile)

## 2023-09-09 ENCOUNTER — Ambulatory Visit (INDEPENDENT_AMBULATORY_CARE_PROVIDER_SITE_OTHER): Payer: Medicaid Other | Admitting: Family Medicine

## 2023-09-09 ENCOUNTER — Encounter: Payer: Self-pay | Admitting: Family Medicine

## 2023-09-09 VITALS — BP 130/84 | HR 88 | Temp 97.6°F | Ht 67.0 in | Wt 188.0 lb

## 2023-09-09 DIAGNOSIS — G44219 Episodic tension-type headache, not intractable: Secondary | ICD-10-CM

## 2023-09-09 DIAGNOSIS — Z87898 Personal history of other specified conditions: Secondary | ICD-10-CM

## 2023-09-09 DIAGNOSIS — E559 Vitamin D deficiency, unspecified: Secondary | ICD-10-CM

## 2023-09-09 DIAGNOSIS — Z5320 Procedure and treatment not carried out because of patient's decision for unspecified reasons: Secondary | ICD-10-CM

## 2023-09-09 DIAGNOSIS — F419 Anxiety disorder, unspecified: Secondary | ICD-10-CM | POA: Diagnosis not present

## 2023-09-09 DIAGNOSIS — F32A Depression, unspecified: Secondary | ICD-10-CM

## 2023-09-09 DIAGNOSIS — F429 Obsessive-compulsive disorder, unspecified: Secondary | ICD-10-CM

## 2023-09-09 DIAGNOSIS — N939 Abnormal uterine and vaginal bleeding, unspecified: Secondary | ICD-10-CM

## 2023-09-09 DIAGNOSIS — R5383 Other fatigue: Secondary | ICD-10-CM | POA: Diagnosis not present

## 2023-09-09 LAB — FOLATE: Folate: 7.9 ng/mL (ref >5.9)

## 2023-09-09 LAB — FERRITIN: Ferritin: 21.5 ng/mL (ref 10.0–291.0)

## 2023-09-09 LAB — VITAMIN B12: Vitamin B-12: 321 pg/mL (ref 211–911)

## 2023-09-09 MED ORDER — VITAMIN D (ERGOCALCIFEROL) 1.25 MG (50000 UNIT) PO CAPS: 50000.0000 [IU] | ORAL_CAPSULE | ORAL | 0 refills | Status: DC

## 2023-09-09 NOTE — Progress Notes (Signed)
Subjective:     Patient ID: Makayla [name], female    DOB: April 14, 1981, 42 y.o.   MRN: [id number]  Chief Complaint  Patient presents with   Medical Management of Chronic Issues    4 week f/u    HPI   History of Present Illness         Here for 4 wk follow up on chronic health conditions.   C/o fatigue   Sleeping well.   Anxiety about leaving the house.  Hot flashes when she has anxiety attacks.   She has been on Effexor since 2018   States she was started on imipramine by her headache specialist in early 2000s and has been reluctant to come off of it because it helped her headaches. Could not take Topamax.    LMP: thanksgiving   Irregular periods   Declines referral to OB/GYN and declines to update PAP   Works at [employer]   Denies having sex.   Denies SI         06/29/2023    9:17 AM  Depression screen PHQ 2/9  Decreased Interest 3  Down, Depressed, Hopeless 3  PHQ - 2 Score 6  Altered sleeping 3  Tired, decreased energy 3  Change in appetite 0  Feeling bad or failure about yourself  3  Trouble concentrating 0  Moving slowly or fidgety/restless 0  Suicidal thoughts 1  PHQ-9 Score 16  Difficult doing work/chores Extremely dIfficult      Health Maintenance Due  Topic Date Due   HIV Screening  Never done   Hepatitis C Screening  Never done   Cervical Cancer Screening (HPV/Pap Cotest)  Never done   DTaP/Tdap/Td (2 - Tdap) 09/22/2016    Past Medical History:  Diagnosis Date   Allergy    Anxiety    Asthma    Depression    Hyperlipidemia    IBS (irritable bowel syndrome)    Migraines    OCD (obsessive compulsive disorder)    TMJ syndrome     Past Surgical History:  Procedure Laterality Date   BIOPSY BREAST Bilateral    TONSILLECTOMY Bilateral    TONSILLECTOMY AND ADENOIDECTOMY      History reviewed. No pertinent family history.  Social History   Socioeconomic History   Marital status: Single    Spouse name: Not on file    Number of children: Not on file   Years of education: Not on file   Highest education level: Bachelor's degree (e.g., BA, AB, BS)  Occupational History   Not on file  Tobacco Use   Smoking status: Never   Smokeless tobacco: Never  Vaping Use   Vaping status: Never Used  Substance and Sexual Activity   Alcohol use: No   Drug use: No   Sexual activity: Not Currently  Other Topics Concern   Not on file  Social History Narrative   Not on file   Social Determinants of Health   Financial Resource Strain: High Risk (07/29/2023)   Overall Financial Resource Strain (CARDIA)    Difficulty of Paying Living Expenses: Very hard  Food Insecurity: Food Insecurity Present (07/29/2023)   Hunger Vital Sign    Worried About Running Out of Food in the Last Year: Sometimes true    Ran Out of Food in the Last Year: Never true  Transportation Needs: Unmet Transportation Needs (07/29/2023)   PRAPARE - Administrator, Civil Service (Medical): Yes    Lack of Transportation (Non-Medical): No  Physical Activity: Sufficiently Active (07/29/2023)   Exercise Vital Sign    Days of Exercise per Week: 2 days    Minutes of Exercise per Session: 150+ min  Stress: Stress Concern Present (07/29/2023)   [employer] of Occupational Health - Occupational Stress Questionnaire    Feeling of Stress : Rather much  Social Connections: Socially Isolated (07/29/2023)   Social Connection and Isolation Panel [NHANES]    Frequency of Communication with Friends and Family: Once a week    Frequency of Social Gatherings with Friends and Family: Never    Attends Religious Services: Never    Database administrator or Organizations: No    Attends Engineer, structural: Not on file    Marital Status: Never married  Intimate Partner Violence: Unknown (01/04/2022)   Received from [employer] [facility], [facility]   HITS    Physically Hurt: Not on file    Insult or Talk Down To: Not on file    Threaten  Physical Harm: Not on file    Scream or Curse: Not on file    Outpatient Medications Prior to Visit  Medication Sig Dispense Refill   busPIRone (BUSPAR) 10 MG tablet Take 1 tablet (10 mg total) by mouth 3 (three) times daily. 60 tablet 2   cyclobenzaprine (FLEXERIL) 10 MG tablet      imipramine (TOFRANIL) 50 MG tablet TAKE 1 TABLET BY MOUTH 3 TIMES A DAY 90 tablet 1   promethazine (PHENERGAN) 25 MG tablet TAKE ONE TABLET BY MOUTH EVERY 4 HOURS AS NEEDED FOR NAUSEA OR HEADACHE (Patient taking differently: once as needed. TAKE ONE TABLET BY MOUTH EVERY 4 HOURS AS NEEDED FOR NAUSEA OR HEADACHE) 90 tablet 0   venlafaxine XR (EFFEXOR-XR) 150 MG 24 hr capsule TAKE ONE CAPSULE BY MOUTH DAILY WITH BREAKFAST 90 capsule 1   No facility-administered medications prior to visit.    Allergies  Allergen Reactions   Zoloft [Sertraline Hcl]     Review of Systems  Constitutional:  Positive for malaise/fatigue. Negative for chills and fever.  Respiratory:  Negative for shortness of breath.   Cardiovascular:  Negative for chest pain, palpitations and leg swelling.  Gastrointestinal:  Positive for nausea. Negative for abdominal pain and vomiting.  Neurological:  Positive for headaches. Negative for dizziness.  Psychiatric/Behavioral:  Positive for depression. Negative for substance abuse and suicidal ideas. The patient is nervous/anxious. The patient does not have insomnia.        Objective:    Physical Exam Constitutional:      General: She is not in acute distress.    Appearance: She is not ill-appearing.  Eyes:     Extraocular Movements: Extraocular movements intact.     Conjunctiva/sclera: Conjunctivae normal.  Cardiovascular:     Rate and Rhythm: Normal rate.  Pulmonary:     Effort: Pulmonary effort is normal.  Musculoskeletal:     Cervical back: Normal range of motion and neck supple.  Skin:    General: Skin is warm and dry.  Neurological:     General: No focal deficit present.      Mental Status: She is alert and oriented to person, place, and time.  Psychiatric:        Mood and Affect: Mood normal.        Behavior: Behavior normal.        Thought Content: Thought content normal.      BP 130/84 (BP Location: Left Arm, Patient Position: Sitting, Cuff Size: Large)   Pulse  88   Temp 97.6 F (36.4 C) (Temporal)   Ht 5\' 7"  (1.702 m)   Wt 188 lb (85.3 kg)   LMP  (Within Months)   SpO2 99%   BMI 29.44 kg/m  Wt Readings from Last 3 Encounters:  09/09/23 188 lb (85.3 kg)  08/22/23 180 lb (81.6 kg)  07/29/23 188 lb 12.8 oz (85.6 kg)       Assessment & Plan:   Problem List Items Addressed This Visit     Abnormal uterine bleeding (AUB)   Anxiety and depression - Primary   Relevant Orders   Ambulatory referral to Psychiatry   Fatigue   Relevant Orders   Vitamin B12 (Completed)   Ferritin (Completed)   Folate (Completed)   History of seizure   OCD (obsessive compulsive disorder)   Relevant Orders   Ambulatory referral to Psychiatry   Tension-type headache   Relevant Medications   cyclobenzaprine (FLEXERIL) 10 MG tablet   Vitamin D deficiency   Relevant Medications   Vitamin D, Ergocalciferol, (DRISDOL) 1.25 MG (50000 UNIT) CAPS capsule   Other Visit Diagnoses     Cervical cancer screening declined          Check vitamin B12, folate, and ferritin for fatigue.  EMR review shows low vitamin D. High dose weekly vitamin D prescribed x 8 weeks.  She will se neurology for headaches.  She has been on a [facility] for anxiety and depression.  Discussed possible medication options today but apparently she has been on several different psychotropic medications in the past. She is on imipramine which prevents adding certain medications. She has been reluctant to come off of this medication in the past since she was started on it for headaches and it was helpful.  Denies SI.  Referral to psychiatry placed. Continue current medications.  Declines  referral to gynecology   I am having Elease Hashimoto A. Hofbauer start on Vitamin D (Ergocalciferol). I am also having her maintain her venlafaxine XR, busPIRone, promethazine, imipramine, and cyclobenzaprine.  Meds ordered this encounter  Medications   Vitamin D, Ergocalciferol, (DRISDOL) 1.25 MG (50000 UNIT) CAPS capsule    Sig: Take 1 capsule (50,000 Units total) by mouth every 7 (seven) days.    Dispense:  8 capsule    Refill:  0    Order Specific Question:   Supervising Provider    Answer:   Hillard Danker A [[id number]]

## 2023-09-09 NOTE — Patient Instructions (Addendum)
Please check with your neurologist regarding switching imipramine to seroquel.   I have referred you to psychiatry and you will receive a call to schedule soon.   Go downstairs for labs.

## 2023-09-20 ENCOUNTER — Ambulatory Visit: Payer: Medicaid Other | Admitting: Family Medicine

## 2023-09-23 ENCOUNTER — Ambulatory Visit: Payer: Medicaid Other | Admitting: Family Medicine

## 2023-09-26 NOTE — Progress Notes (Addendum)
 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  Continue imipramine  for now with plan to eventually taper off Migraine rescue:  Stop Tylenol.  Start rizatripan.  Continue Flexeril  and promethazine  Limit use of pain relievers to no more than 2 days out of week to prevent risk of rebound or medication-overuse headache. Keep headache diary Follow up 5 months.    Subjective:  Makayla [name] is a 42 year old right-handed female with depression, anxiety, OCD, asthma, IBS, and intermittent TMJ dysfunction who presents for headache and seizure.  History supplemented by referring provider's notes.  Migraine without aura & Tension-headaches: Started 2005.  Starts in shoulders and neck bilaterally and wraps around head.  Moderate but may progress to severe.  Squeezing, sometimes feels like she was hit in back of head with baseball bat.  N/V when severe, photo, phono, Can't get comfortable. Relieving:  ice pack, vomiting,  usually last severe:  several days, moderate-once sleep it off.  Frequency:  once a week, severe onces once a month 2-3 days  Treats with Flexeril  and Biofreeze to face, neck and shoulders.  Triggers - hunger, stress, prolonged screen time  Seizures: After 2005-2007 Suspect psychogenic - stress related to job.  2 black outs.  Black out of vision and start shaking and then will drop to floor in crouching position to wait it out.  Hittign head on floor repeatedly.  Found self on floor in bedroom and not remember how got there.  Another time, she fell and hit head on bathroom sink.  Treated for anxiety with  Xanax  and resolved and quit stressful job.  EEG and MRI negative.  She had a similar episode after taken off of Xanax  last year - brief episode of visoin going black but no LOC.  Past NSAIDS/analgesics:  ketorolac , ibuprofen, naproxen  Past abortive triptans:  none Past abortive ergotamine:  none Past muscle relaxants:  none Past anti-emetic:  none Past antihypertensive medications:  none Past antidepressant medications:  citalopram , fluoxetine  Past anticonvulsant medications:  topiramate Past anti-CGRP:  none Past vitamins/Herbal/Supplements:  none Past antihistamines/decongestants:  none Other past therapies:  trigger point injections.  Current NSAIDS/analgesics:  Extra-strength Tylenol (usually once a week to every other week) Current triptans:  none Current ergotamine:  none Current anti-emetic:  promethazine  25mg  Current muscle relaxants:  cyclobenzaprine  10mg  Current Antihypertensive medications:  none Current Antidepressant medications:  venlafaxine  XR 150mg  daily (anxiety), imipramine  50mg  TID Current Anticonvulsant medications:  none Current anti-CGRP:  none Other therapy:  none Other medications:  buspirone    Caffeine:  Soda daily.  No coffee Alcohol:  just once or twice a year Smoker:  no Diet:  16 oz water daily.  Soda daily.  Does not skip meals. Exercise:  not routine Depression:  yes; Anxiety:  yes Other pain:  bilateral knee pain Sleep hygiene:  good.  Does not feel rested in morning despite sleeping well.  Reports that she snores.   Family history of headache:  mom (migraines)      PAST MEDICAL HISTORY: Past Medical History:  Diagnosis Date   Allergy    Anxiety    Asthma    Depression    Hyperlipidemia  IBS (irritable bowel syndrome)    Migraines    OCD (obsessive compulsive disorder)    TMJ syndrome     PAST SURGICAL HISTORY: Past Surgical History:  Procedure Laterality Date   BIOPSY BREAST Bilateral    TONSILLECTOMY Bilateral     TONSILLECTOMY AND ADENOIDECTOMY      MEDICATIONS: Current Outpatient Medications on File Prior to Visit  Medication Sig Dispense Refill   busPIRone  (BUSPAR ) 10 MG tablet Take 1 tablet (10 mg total) by mouth 3 (three) times daily. 60 tablet 2   cyclobenzaprine  (FLEXERIL ) 10 MG tablet      imipramine  (TOFRANIL ) 50 MG tablet TAKE 1 TABLET BY MOUTH 3 TIMES A DAY 90 tablet 1   promethazine  (PHENERGAN ) 25 MG tablet TAKE ONE TABLET BY MOUTH EVERY 4 HOURS AS NEEDED FOR NAUSEA OR HEADACHE (Patient taking differently: once as needed. TAKE ONE TABLET BY MOUTH EVERY 4 HOURS AS NEEDED FOR NAUSEA OR HEADACHE) 90 tablet 0   venlafaxine  XR (EFFEXOR -XR) 150 MG 24 hr capsule TAKE ONE CAPSULE BY MOUTH DAILY WITH BREAKFAST 90 capsule 1   Vitamin D , Ergocalciferol , (DRISDOL ) 1.25 MG (50000 UNIT) CAPS capsule Take 1 capsule (50,000 Units total) by mouth every 7 (seven) days. 8 capsule 0   No current facility-administered medications on file prior to visit.    ALLERGIES: Allergies  Allergen Reactions   Zoloft [Sertraline Hcl]     FAMILY HISTORY: No family history on file.  Objective:  Blood pressure 97/65, pulse 75, height 5\' 7"  (1.702 m), weight 186 lb (84.4 kg), SpO2 95%. General: No acute distress.  Patient appears well-groomed.   Head:  Normocephalic/atraumatic Eyes:  fundi examined but not visualized Neck: supple, no paraspinal tenderness, full range of motion Heart: regular rate and rhythm Neurological Exam: Mental status: alert and oriented to person, place, and time, speech fluent and not dysarthric, language intact. Cranial nerves: CN I: not tested CN II: pupils equal, round and reactive to light, visual fields intact CN III, IV, VI:  full range of motion, no nystagmus, no ptosis CN V: facial sensation intact. CN VII: upper and lower face symmetric CN VIII: hearing intact CN IX, X: gag intact, uvula midline CN XI: sternocleidomastoid and trapezius muscles intact CN XII: tongue  midline Bulk & Tone: normal, no fasciculations. Motor:  muscle strength 5/5 throughout Sensation:  Pinprick and vibratory sensation intact. Deep Tendon Reflexes:  2+ throughout,  toes downgoing.   Finger to nose testing:  Without dysmetria.   Gait:  Normal station and stride.  Romberg negative.    Thank you for allowing me to take part in the care of this patient.  Janne Members, DO  CC:  Alyson Back, NP-C  Abbey Hobby, NP

## 2023-09-27 ENCOUNTER — Ambulatory Visit: Payer: Medicaid Other | Admitting: Neurology

## 2023-09-27 ENCOUNTER — Encounter: Payer: Self-pay | Admitting: Neurology

## 2023-09-27 VITALS — BP 97/65 | HR 75 | Ht 67.0 in | Wt 186.0 lb

## 2023-09-27 DIAGNOSIS — Z87898 Personal history of other specified conditions: Secondary | ICD-10-CM

## 2023-09-27 DIAGNOSIS — G43009 Migraine without aura, not intractable, without status migrainosus: Secondary | ICD-10-CM | POA: Diagnosis not present

## 2023-09-27 MED ORDER — RIZATRIPTAN BENZOATE 10 MG PO TABS: ORAL_TABLET | ORAL | 5 refills | Status: AC

## 2023-09-27 MED ORDER — AIMOVIG 140 MG/ML ~~LOC~~ SOAJ: 140.0000 mg | SUBCUTANEOUS | 11 refills | Status: DC

## 2023-09-27 MED ORDER — CYCLOBENZAPRINE HCL 10 MG PO TABS: 10.0000 mg | ORAL_TABLET | Freq: Three times a day (TID) | ORAL | 5 refills | Status: AC | PRN

## 2023-09-27 NOTE — Patient Instructions (Signed)
  Start Aimovig 140mg  every 28 days.  Continue imipramine for now STOP TYLENOL.  Take rizatriptan 10mg  at earliest onset of headache.  May repeat dose once in 2 hours if needed.  Maximum 2 tablets in 24 hours. Use cyclobenzaprine for neck pain and promethazine for nausea Limit use of pain relievers to no more than 2 days out of the week.  These medications include acetaminophen, NSAIDs (ibuprofen/Advil/Motrin, naproxen/Aleve, triptans (Imitrex/sumatriptan), Excedrin, and narcotics.  This will help reduce risk of rebound headaches. Be aware of common food triggers Routine exercise Stay adequately hydrated (aim for 64 oz water daily) Keep headache diary Maintain proper stress management Maintain proper sleep hygiene Do not skip meals Consider supplements:  magnesium citrate 400mg  daily, riboflavin 400mg  daily, coenzyme Q10 300mg  daily
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
## 2023-09-30 ENCOUNTER — Ambulatory Visit: Payer: Self-pay | Admitting: Nurse Practitioner

## 2023-10-11 ENCOUNTER — Encounter: Payer: Self-pay | Admitting: Family Medicine

## 2023-10-11 ENCOUNTER — Ambulatory Visit (INDEPENDENT_AMBULATORY_CARE_PROVIDER_SITE_OTHER): Payer: Medicaid Other | Admitting: Family Medicine

## 2023-10-11 VITALS — BP 122/84 | Ht 66.0 in | Wt 180.0 lb

## 2023-10-11 DIAGNOSIS — M222X1 Patellofemoral disorders, right knee: Secondary | ICD-10-CM | POA: Diagnosis not present

## 2023-10-11 NOTE — Progress Notes (Signed)
 PCP: Lendia Boby CROME, NP-C  Subjective:   HPI: Patient is a 43 y.o. female here for x1 year of right knee pain. Patient reports onset of bilateral sharp pain to knees when crouching and standing back up about a year ago. No history of prior trauma or surgery to the knee. Pain is not constant and only occurs with crouching. Left knee has mostly resolved, but right knee persisted to the point of prompting the patient to visit UC about 1-2 months ago where they reportedly noted inflammation of the knee and treated with short course of oral steroids and an unspecified NSAID which improved pain. Right knee now appears to be more aggravated over the last couple of days. No pain at rest. Discomfort is localized to the right anterior knee without radiating pain, numbness, or tingling. Notes throbbing sensation when legs are crossed for long periods of time. Discomfort with descending stairs. She has noted some swelling of the right knee when compared to the left. 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 No redness, warmth, or fever. No issues with ambulation or deficits in ADLs.   Past Medical History:  Diagnosis Date   Allergy    Anxiety    Asthma    Depression    Hyperlipidemia    IBS (irritable bowel syndrome)    Migraines    OCD (obsessive compulsive disorder)    TMJ syndrome     Current Outpatient Medications on File Prior to Visit  Medication Sig Dispense Refill   busPIRone  (BUSPAR ) 10 MG tablet Take 1 tablet (10 mg total) by mouth 3 (three) times daily. 60 tablet 2   cyclobenzaprine  (FLEXERIL ) 10 MG tablet Take 1 tablet (10 mg total) by mouth 3 (three) times daily as needed for muscle spasms. 90 tablet 5   Erenumab -aooe (AIMOVIG ) 140 MG/ML SOAJ Inject 140 mg into the skin every 28 (twenty-eight) days. 1.12 mL 11   imipramine  (TOFRANIL ) 50 MG tablet TAKE 1 TABLET BY MOUTH 3 TIMES A DAY 90 tablet 1   promethazine  (PHENERGAN ) 25 MG tablet TAKE ONE  TABLET BY MOUTH EVERY 4 HOURS AS NEEDED FOR NAUSEA OR HEADACHE (Patient taking differently: once as needed. TAKE ONE TABLET BY MOUTH EVERY 4 HOURS AS NEEDED FOR NAUSEA OR HEADACHE) 90 tablet 0   rizatriptan  (MAXALT ) 10 MG tablet Take 1 tablet at earliest onset of headache.  May repeat in 2 hours if needed.  Maximum 2 tablets in 24 hours. 10 tablet 5   venlafaxine  XR (EFFEXOR -XR) 150 MG 24 hr capsule TAKE ONE CAPSULE BY MOUTH DAILY WITH BREAKFAST 90 capsule 1   Vitamin D , Ergocalciferol , (DRISDOL ) 1.25 MG (50000 UNIT) CAPS capsule Take 1 capsule (50,000 Units total) by mouth every 7 (seven) days. 8 capsule 0   No current facility-administered medications on file prior to visit.    Past Surgical History:  Procedure Laterality Date   BIOPSY BREAST Bilateral    TONSILLECTOMY Bilateral    TONSILLECTOMY AND ADENOIDECTOMY      Allergies  Allergen Reactions   Zoloft [Sertraline Hcl]     BP 122/84   Ht 5' 6 (1.676 m)   Wt 180 lb (81.6 kg)   BMI 29.05 kg/m       No data to display              No data to display              Objective:  Physical Exam:  Gen:  NAD, comfortable in exam room HIP: full range of motion bilaterally without pain.  Global hip weakness 4/5 bilaterally, also associated glute weakness with (+)Trendelenburg test bilaterally KNEE: Bilateral knees with FROM with minimal pain noted over the right anterolateral knee.  Trace effusion right knee.  No medial or lateral joint line tenderness.  No tenderness over patellar facets on the right, no tenderness over tibial tubercle.  No tenderness along quad or patellar tendon.  Notable weakness of bilateral quads. Negative McMurray, Lachman,  varus/valgus stress, posterior drawer.  (+)patellar grind, patellar inhibition on the right, neg on the left.  Normal alignment, no leg length difference Preserved longitudinal and transverse arches of the feet Walking normal without a limp.   Assessment & Plan:  1.  Patellofemoral syndrome Anterior knee pain that worsens with squatting, prolonged sitting, and descending stairs likely due to overuse in the setting of weak surrounding musculature. Trace effusion without indication for further imaging or drainage. No evidence for septic joint or there urgent etiology such as fracture or muscle or tendon injury. Discussed treatment with home strength exercises versus formal PT. Patient elected home exercises with focus on strengthening the quadriceps, hip abductors and core muscles. Discussed pain managed with OTC NSAIDs and supportive interventions such as a knee brace. The patient expressed understanding and agreed with plan. Follow-up in 6 weeks.   Delene Corners, MS4 [facility] [employer] [employer]  Addendum:  Patient seen and examined in the office with medical student - Delene Corners, MS4.  History reviewed by me.  I examined the patient independently with student.  Agree with findings and plan as documented in student note.  Rainell Cedar, DO, CAQSM

## 2023-10-11 NOTE — Patient Instructions (Addendum)
 You have patellofemoral syndrome. Avoid painful activities when possible (often deep squats, lunges, leg press bother this). Cross train with swimming, cycling with low resistance, elliptical if needed. Perform home exercises as demonstrated most days of the week Avoid flat shoes, barefoot walking as much as possible. Icing 15 minutes at a time 3-4 times a day as needed. Tylenol or ibuprofen as needed for pain. Follow up with me in 6 weeks.

## 2023-10-14 ENCOUNTER — Other Ambulatory Visit: Payer: Self-pay | Admitting: Nurse Practitioner

## 2023-10-20 ENCOUNTER — Other Ambulatory Visit: Payer: Self-pay | Admitting: Nurse Practitioner

## 2023-11-03 ENCOUNTER — Other Ambulatory Visit: Payer: Self-pay | Admitting: Family Medicine

## 2023-11-03 NOTE — Telephone Encounter (Signed)
Per PCP would like for neurology to take over since he is now managing

## 2023-11-03 NOTE — Telephone Encounter (Signed)
Copied from CRM [id number]. Topic: Clinical - Medication Refill >> Nov 03, 2023  1:38 PM Isabell A wrote: Most Recent Primary Care Visit:  Provider: Avanell Shackleton  Department: [facility]  Visit Type: OFFICE VISIT  Date: 09/09/2023  Medication: imipramine (TOFRANIL) 50 MG tablet  Has the patient contacted their pharmacy? Yes (Agent: If no, request that the patient contact the pharmacy for the refill. If patient does not wish to contact the pharmacy document the reason why and proceed with request.) (Agent: If yes, when and what did the pharmacy advise?)  Is this the correct pharmacy for this prescription? Yes If no, delete pharmacy and type the correct one.  This is the patient's preferred pharmacy:  [facility] [id number] [place], [place] - [address] [place] [address] Phone: [phone] Fax: [phone]   Has the prescription been filled recently? Yes  Is the patient out of the medication? Yes  Has the patient been seen for an appointment in the last year OR does the patient have an upcoming appointment? Yes  Can we respond through MyChart? Yes  Agent: Please be advised that Rx refills may take up to 3 business days. We ask that you follow-up with your pharmacy.

## 2023-11-04 ENCOUNTER — Ambulatory Visit: Payer: Medicaid Other | Admitting: Family Medicine

## 2023-11-04 MED ORDER — IMIPRAMINE HCL 50 MG PO TABS: 50.0000 mg | ORAL_TABLET | Freq: Three times a day (TID) | ORAL | 1 refills | Status: DC

## 2023-11-11 ENCOUNTER — Ambulatory Visit (INDEPENDENT_AMBULATORY_CARE_PROVIDER_SITE_OTHER): Payer: Medicaid Other

## 2023-11-11 ENCOUNTER — Encounter: Payer: Self-pay | Admitting: Family Medicine

## 2023-11-11 ENCOUNTER — Ambulatory Visit: Payer: Medicaid Other | Admitting: Family Medicine

## 2023-11-11 VITALS — BP 112/68 | HR 87 | Temp 97.6°F | Ht 66.0 in | Wt 190.0 lb

## 2023-11-11 DIAGNOSIS — F429 Obsessive-compulsive disorder, unspecified: Secondary | ICD-10-CM

## 2023-11-11 DIAGNOSIS — R5383 Other fatigue: Secondary | ICD-10-CM

## 2023-11-11 DIAGNOSIS — F419 Anxiety disorder, unspecified: Secondary | ICD-10-CM | POA: Diagnosis not present

## 2023-11-11 DIAGNOSIS — R071 Chest pain on breathing: Secondary | ICD-10-CM | POA: Diagnosis not present

## 2023-11-11 DIAGNOSIS — E559 Vitamin D deficiency, unspecified: Secondary | ICD-10-CM | POA: Diagnosis not present

## 2023-11-11 DIAGNOSIS — F32A Depression, unspecified: Secondary | ICD-10-CM | POA: Diagnosis not present

## 2023-11-11 DIAGNOSIS — Z87898 Personal history of other specified conditions: Secondary | ICD-10-CM | POA: Diagnosis not present

## 2023-11-11 DIAGNOSIS — G43809 Other migraine, not intractable, without status migrainosus: Secondary | ICD-10-CM

## 2023-11-11 LAB — COMPREHENSIVE METABOLIC PANEL
ALT: 11 U/L (ref 0–35)
AST: 14 U/L (ref 0–37)
Albumin: 4.3 g/dL (ref 3.5–5.2)
Alkaline Phosphatase: 93 U/L (ref 39–117)
BUN: 9 mg/dL (ref 6–23)
CO2: 28 meq/L (ref 19–32)
Calcium: 8.9 mg/dL (ref 8.4–10.5)
Chloride: 103 meq/L (ref 96–112)
Creatinine, Ser: 0.76 mg/dL (ref 0.40–1.20)
GFR: 95.6 mL/min (ref >60.00)
Glucose, Bld: 87 mg/dL (ref 70–99)
Potassium: 4.4 meq/L (ref 3.5–5.1)
Sodium: 142 meq/L (ref 135–145)
Total Bilirubin: 0.4 mg/dL (ref 0.2–1.2)
Total Protein: 7.1 g/dL (ref 6.0–8.3)

## 2023-11-11 LAB — CBC WITH DIFFERENTIAL/PLATELET
Basophils Absolute: 0 10*3/uL (ref 0.0–0.1)
Basophils Relative: 0.8 % (ref 0.0–3.0)
Eosinophils Absolute: 0.2 10*3/uL (ref 0.0–0.7)
Eosinophils Relative: 2.8 % (ref 0.0–5.0)
HCT: 41.1 % (ref 36.0–46.0)
Hemoglobin: 13.6 g/dL (ref 12.0–15.0)
Lymphocytes Relative: 20.8 % (ref 12.0–46.0)
Lymphs Abs: 1.2 10*3/uL (ref 0.7–4.0)
MCHC: 33.1 g/dL (ref 30.0–36.0)
MCV: 91.6 fL (ref 78.0–100.0)
Monocytes Absolute: 0.5 10*3/uL (ref 0.1–1.0)
Monocytes Relative: 8.3 % (ref 3.0–12.0)
Neutro Abs: 4 10*3/uL (ref 1.4–7.7)
Neutrophils Relative %: 67.3 % (ref 43.0–77.0)
Platelets: 223 10*3/uL (ref 150.0–400.0)
RBC: 4.49 Mil/uL (ref 3.87–5.11)
RDW: 12.5 % (ref 11.5–15.5)
WBC: 6 10*3/uL (ref 4.0–10.5)

## 2023-11-11 LAB — VITAMIN D 25 HYDROXY (VIT D DEFICIENCY, FRACTURES): VITD: 22.7 ng/mL — ABNORMAL LOW (ref 30.00–100.00)

## 2023-11-11 LAB — TSH: TSH: 2.85 u[IU]/mL (ref 0.35–5.50)

## 2023-11-11 LAB — TROPONIN I (HIGH SENSITIVITY): High Sens Troponin I: 3 ng/L (ref 2–17)

## 2023-11-11 LAB — D-DIMER, QUANTITATIVE: D-Dimer, Quant: <0.19 ug{FEU}/mL (ref <0.50)

## 2023-11-11 NOTE — Progress Notes (Signed)
 Subjective:     Makayla ID: Makayla [name], female    DOB: 01-24-1981, 43 y.o.   MRN: [id number]  Chief Complaint  Makayla presents with   Medical Management of Chronic Issues    8 week f/u, wants referral to a different neurologist ([facility])   Referral for counseling ([facility])     HPI   History of Present Illness          States Makayla [name] is having a harder time leaving the house. States Makayla [name] is having a lot of dread.  Denies SI. Makayla [name] is not self medicating.   States Makayla [name] is having chest pain that started in January. Pain is in the center of Makayla [name] chest. Pressure sensation. States it feels like Makayla [name] can't breath. Makayla [name] only has this pain when at work. It is not as bothersome or as frequently as in the past. Makayla [name] went to the ED in 2023 with similar symptoms.   Denies shortness of breath.   States Makayla [name] is washing Makayla [name] hands more which is an issue with Makayla [name] OCD  Denies hx of PE or DVT. No leg pain or swelling. Only knee pain.    Health Maintenance Due  Topic Date Due   COVID-19 Vaccine (1) Never done   Pneumococcal Vaccine 47-37 Years old (1 of 2 - PCV) Never done   HIV Screening  Never done   Hepatitis C Screening  Never done   Cervical Cancer Screening (HPV/Pap Cotest)  Never done   DTaP/Tdap/Td (2 - Tdap) 09/22/2016    Past Medical History:  Diagnosis Date   Allergy    Anxiety    Asthma    Depression    Hyperlipidemia    IBS (irritable bowel syndrome)    Migraines    OCD (obsessive compulsive disorder)    TMJ syndrome     Past Surgical History:  Procedure Laterality Date   BIOPSY BREAST Bilateral    TONSILLECTOMY Bilateral    TONSILLECTOMY AND ADENOIDECTOMY      History reviewed. No pertinent family history.  Social History   Socioeconomic History   Marital status: Single    Spouse name: Not on file   Number of children: Not on file   Years of education: Not on file   Highest education level: Bachelor's degree (e.g., BA, AB,  BS)  Occupational History   Not on file  Tobacco Use   Smoking status: Never   Smokeless tobacco: Never  Vaping Use   Vaping status: Never Used  Substance and Sexual Activity   Alcohol use: No   Drug use: No   Sexual activity: Not Currently  Other Topics Concern   Not on file  Social History Narrative   Are you right handed or left handed? Right handed   Are you currently employed ? 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 No    Who lives with you? Roommate    What type of home do you live in: 1 story or 2 story? Lives in a two story home       Social Drivers of Health   Financial Resource Strain: High Risk (11/11/2023)   Overall Financial Resource Strain (CARDIA)    Difficulty of Paying Living Expenses: Very hard  Food Insecurity: Unknown (11/11/2023)   Hunger Vital Sign    Worried About Running Out of Food in the Last Year: Makayla declined    Barista in  the Last Year: Never true  Transportation Needs: Unmet Transportation Needs (11/11/2023)   PRAPARE - Administrator, Civil Service (Medical): Yes    Lack of Transportation (Non-Medical): No  Physical Activity: Insufficiently Active (11/11/2023)   Exercise Vital Sign    Days of Exercise per Week: 4 days    Minutes of Exercise per Session: 30 min  Stress: Stress Concern Present (11/11/2023)   [employer] of Occupational Health - Occupational Stress Questionnaire    Feeling of Stress : Very much  Social Connections: Socially Isolated (11/11/2023)   Social Connection and Isolation Panel [NHANES]    Frequency of Communication with Friends and Family: Once a week    Frequency of Social Gatherings with Friends and Family: Once a week    Attends Religious Services: Never    Database Administrator or Organizations: No    Attends Engineer, Structural: Not on file    Marital Status: Never married  Intimate Partner Violence: Unknown (01/04/2022)   Received from [employer]   HITS    Physically Hurt: Not on file    Insult or Talk Down To: Not on file    Threaten Physical Harm: Not on file    Scream or Curse: Not on file    Outpatient Medications Prior to Visit  Medication Sig Dispense Refill   busPIRone  (BUSPAR ) 10 MG tablet TAKE 1 TABLET BY MOUTH 3 TIMES A DAY 60 tablet 2   cyclobenzaprine  (FLEXERIL ) 10 MG tablet Take 1 tablet (10 mg total) by mouth 3 (three) times daily as needed for muscle spasms. 90 tablet 5   Erenumab -aooe (AIMOVIG ) 140 MG/ML SOAJ Inject 140 mg into the skin every 28 (twenty-eight) days. 1.12 mL 11   imipramine  (TOFRANIL ) 50 MG tablet Take 1 tablet (50 mg total) by mouth 3 (three) times daily. 90 tablet 1   promethazine  (PHENERGAN ) 25 MG tablet TAKE ONE TABLET BY MOUTH EVERY 4 HOURS AS NEEDED FOR NAUSEA OR HEADACHE (Makayla taking differently: once as needed. TAKE ONE TABLET BY MOUTH EVERY 4 HOURS AS NEEDED FOR NAUSEA OR HEADACHE) 90 tablet 0   rizatriptan  (MAXALT ) 10 MG tablet Take 1 tablet at earliest onset of headache.  May repeat in 2 hours if needed.  Maximum 2 tablets in 24 hours. 10 tablet 5   venlafaxine  XR (EFFEXOR -XR) 150 MG 24 hr capsule TAKE ONE CAPSULE BY MOUTH DAILY WITH BREAKFAST 90 capsule 1   Vitamin D , Ergocalciferol , (DRISDOL ) 1.25 MG (50000 UNIT) CAPS capsule Take 1 capsule (50,000 Units total) by mouth every 7 (seven) days. 8 capsule 0   No facility-administered medications prior to visit.    Allergies  Allergen Reactions   Zoloft [Sertraline Hcl]     Review of Systems  Constitutional:  Positive for malaise/fatigue. Negative for chills and fever.  HENT:  Negative for congestion and sore throat.   Respiratory:  Positive for shortness of breath.   Cardiovascular:  Positive for chest pain. Negative for palpitations and leg swelling.  Gastrointestinal:  Negative for abdominal pain, constipation, diarrhea, nausea and vomiting.  Genitourinary:  Negative for dysuria, frequency and urgency.   Neurological:  Negative for dizziness and focal weakness.  Psychiatric/Behavioral:  Positive for depression. The Makayla is nervous/anxious.        Objective:    Physical Exam Constitutional:      General: Makayla [name] is not in acute distress.    Appearance: Makayla [name] is not ill-appearing.  Eyes:     Extraocular Movements:  Extraocular movements intact.     Conjunctiva/sclera: Conjunctivae normal.  Cardiovascular:     Rate and Rhythm: Normal rate and regular rhythm.  Pulmonary:     Effort: Pulmonary effort is normal.     Breath sounds: Normal breath sounds.  Musculoskeletal:     Cervical back: Normal range of motion and neck supple.  Skin:    General: Skin is warm and dry.  Neurological:     General: No focal deficit present.     Mental Status: Makayla [name] is alert and oriented to person, place, and time.     Motor: No weakness.     Coordination: Coordination normal.  Psychiatric:        Mood and Affect: Mood normal.        Behavior: Behavior normal.        Thought Content: Thought content normal.      BP 112/68 (BP Location: Left Arm, Makayla Position: Sitting, Cuff Size: Large)   Pulse 87   Temp 97.6 F (36.4 C) (Temporal)   Ht 5' 6 (1.676 m)   Wt 190 lb (86.2 kg)   SpO2 99%   BMI 30.67 kg/m  Wt Readings from Last 3 Encounters:  11/11/23 190 lb (86.2 kg)  10/11/23 180 lb (81.6 kg)  09/27/23 186 lb (84.4 kg)       Assessment & Plan:   Problem List Items Addressed This Visit     Anxiety and depression   Relevant Orders   Ambulatory referral to Psychiatry   Fatigue   Relevant Orders   CBC with Differential/Platelet (Completed)   Comprehensive metabolic panel (Completed)   TSH (Completed)   History of seizure   Migraines   Relevant Orders   Ambulatory referral to Neurology   OCD (obsessive compulsive disorder)   Relevant Orders   Ambulatory referral to Psychiatry   Vitamin D  deficiency   Relevant Orders   VITAMIN D  25 Hydroxy (Vit-D Deficiency, Fractures)  (Completed)   Other Visit Diagnoses       Chest pain on breathing    -  Primary   Relevant Orders   CBC with Differential/Platelet (Completed)   Comprehensive metabolic panel (Completed)   Troponin I (High Sensitivity) (Completed)   TSH (Completed)   D-dimer, quantitative (Completed)   DG Chest 2 View (Completed)   EKG 12-Lead      EKG shows NSR, rate 80, flat T waves in V2 No acute distress. Pain is reportedly when going to work or while at work and appears to be associated with anxiety and stress. Labs and chest X ray to r/o cardiopulmonary issue.  Referral to [facility] per Makayla request for migraines and hx of seizure.  Anxiety and OCD not well controlled.  Makayla [name] does not appear to be in any danger to herself. Makayla [name] is taking Buspar  and Effexor  with minimal improvement. Referral to psychiatry. Makayla [name] would like to go to [facility] for medications and counseling.  Check vitamin D  level and follow up  I am having Makayla A. Counihan maintain Makayla [name] venlafaxine  XR, promethazine , Vitamin D  (Ergocalciferol ), cyclobenzaprine , Aimovig , rizatriptan , busPIRone , and imipramine .  No orders of the defined types were placed in this encounter.

## 2023-11-11 NOTE — Patient Instructions (Signed)
 He please go downstairs for labs and a chest x-ray before you leave.  I placed referrals to [facility] neurology and [facility] today.

## 2023-11-22 ENCOUNTER — Ambulatory Visit: Payer: Medicaid Other | Admitting: Family Medicine

## 2023-12-12 ENCOUNTER — Telehealth: Payer: Self-pay | Admitting: Neurology

## 2023-12-15 ENCOUNTER — Ambulatory Visit: Payer: Medicaid Other | Admitting: Family Medicine

## 2023-12-23 DIAGNOSIS — F330 Major depressive disorder, recurrent, mild: Secondary | ICD-10-CM | POA: Diagnosis not present

## 2023-12-23 DIAGNOSIS — F411 Generalized anxiety disorder: Secondary | ICD-10-CM | POA: Diagnosis not present

## 2023-12-23 DIAGNOSIS — F429 Obsessive-compulsive disorder, unspecified: Secondary | ICD-10-CM | POA: Diagnosis not present

## 2024-01-06 ENCOUNTER — Telehealth: Payer: Self-pay | Admitting: Neurology

## 2024-01-06 ENCOUNTER — Other Ambulatory Visit: Payer: Self-pay | Admitting: Family Medicine

## 2024-01-06 MED ORDER — IMIPRAMINE HCL 50 MG PO TABS: 50.0000 mg | ORAL_TABLET | Freq: Three times a day (TID) | ORAL | 1 refills | Status: DC

## 2024-01-06 NOTE — Telephone Encounter (Signed)
 Advised patient no refill request for the medication has come through. Per last ov note, Migraine prevention:  Plan to start Aimovig 140mg .  Continue imipramine for now with plan to eventually taper off.  Per patient the Aimovig per the insurance cost $300.  Advised patient it maybe the insurance will not cover the medication and I will call to the pharmacy to see if there was a alternative medication mentioned the insurance will cover.   Per the Pharmacy the Aimovig needs a PA.   PA team please start that for the patient.    Patient advised of PA needed.

## 2024-01-06 NOTE — Telephone Encounter (Signed)
 Pt said the pharmacy told her they sent in a refill request for medication, Impramine 50mg . She would like a call back  Pharmacy [facility]

## 2024-01-06 NOTE — Telephone Encounter (Signed)
 Patient advised, Per Dr.Patel I signed the refill for imipramine.

## 2024-01-06 NOTE — Telephone Encounter (Signed)
 Copied from CRM [id number]. Topic: Clinical - Medication Refill >> Jan 06, 2024 11:05 AM Randa Ngo wrote: Most Recent Primary Care Visit:  Provider: Avanell Shackleton  Department: [facility]  Visit Type: OFFICE VISIT  Date: 11/11/2023  Medication: imipramine (TOFRANIL) 50 MG tablet  Has the patient contacted their pharmacy? No, patient does not get seen by neurologist until June and wondering if PCP can fill before then. (Agent: If no, request that the patient contact the pharmacy for the refill. If patient does not wish to contact the pharmacy document the reason why and proceed with request.) (Agent: If yes, when and what did the pharmacy advise?)  Is this the correct pharmacy for this prescription? Yes If no, delete pharmacy and type the correct one.  This is the patient's preferred pharmacy:  [facility] [id number] [place], [place] - [address] [place] [address] Phone: [phone] Fax: [phone]   Has the prescription been filled recently? No  Is the patient out of the medication? Yes  Has the patient been seen for an appointment in the last year OR does the patient have an upcoming appointment? Yes  Can we respond through MyChart? Yes  Agent: Please be advised that Rx refills may take up to 3 business days. We ask that you follow-up with your pharmacy.

## 2024-01-10 ENCOUNTER — Telehealth: Payer: Self-pay | Admitting: Pharmacy Technician

## 2024-01-10 NOTE — Telephone Encounter (Signed)
 PA has been submitted, and telephone encounter has been created. Please see telephone encounter dated 4.8.25.

## 2024-01-10 NOTE — Telephone Encounter (Signed)
 Pharmacy Patient Advocate Encounter   Received notification from Pt Calls Messages that prior authorization for AIMOVIG 140MG  is required/requested.   Insurance verification completed.   The patient is insured through [facility] .   Per test claim: PA required; PA submitted to above mentioned insurance via CoverMyMeds Key/confirmation #/EOC [id number] Status is pending

## 2024-01-18 NOTE — Telephone Encounter (Signed)
 Pharmacy Patient Advocate Encounter  Received notification from [employer] that Prior Authorization for AIMOVIG 140MG  has been DENIED.  Full denial letter will be uploaded to the media tab. See denial reason below.   PA #/Case ID/Reference #: [id number]

## 2024-01-25 NOTE — Telephone Encounter (Signed)
 Information has been sent to clinical pharmacist for appeals review. It may take 5-7 days to prepare the necessary documentation to request the appeal from the insurance.

## 2024-01-26 ENCOUNTER — Telehealth: Payer: Self-pay | Admitting: Pharmacist

## 2024-01-26 NOTE — Telephone Encounter (Signed)
 Appeal has been submitted for Aimovig . Will advise when response is received, please be advised that most companies may take 30 days to make a decision. Appeal letter and supporting documentation have been faxed to [phone] on 01/26/2024 @10 :04 am.  Thank you, Dene Fines, PharmD Clinical Pharmacist  [facility]  Direct Dial: [phone]

## 2024-01-27 DIAGNOSIS — F330 Major depressive disorder, recurrent, mild: Secondary | ICD-10-CM | POA: Diagnosis not present

## 2024-01-27 DIAGNOSIS — F429 Obsessive-compulsive disorder, unspecified: Secondary | ICD-10-CM | POA: Diagnosis not present

## 2024-01-27 DIAGNOSIS — F411 Generalized anxiety disorder: Secondary | ICD-10-CM | POA: Diagnosis not present

## 2024-02-01 ENCOUNTER — Telehealth: Payer: Self-pay | Admitting: Neurology

## 2024-02-01 DIAGNOSIS — G4719 Other hypersomnia: Secondary | ICD-10-CM

## 2024-02-01 DIAGNOSIS — R0683 Snoring: Secondary | ICD-10-CM

## 2024-02-01 NOTE — Telephone Encounter (Signed)
 Patient is wanting to speak to someone about setting up a sleep study

## 2024-02-01 NOTE — Telephone Encounter (Signed)
 Per patient discussed in the past with Dr.jaffe, now she thinks she will need the referral.  Advised patient Dr.Jaffe is out of the office this week, but we will relay this to him and he will advised of next steps.

## 2024-02-02 DIAGNOSIS — S8001XA Contusion of right knee, initial encounter: Secondary | ICD-10-CM | POA: Diagnosis not present

## 2024-02-02 DIAGNOSIS — M25561 Pain in right knee: Secondary | ICD-10-CM | POA: Diagnosis not present

## 2024-02-06 ENCOUNTER — Other Ambulatory Visit (HOSPITAL_COMMUNITY): Payer: Self-pay

## 2024-02-06 NOTE — Telephone Encounter (Signed)
 Per Dr.Jaffe, OK to send referral to sleep medicine for evaluation of sleep apnea - patient reports she sleeps well but has excessive daytime sleepiness and she snores.    LMOVM for patient

## 2024-02-06 NOTE — Telephone Encounter (Signed)
 The appeal for Aimovig  has been approved by the insurance, it is good through 05/01/2024.

## 2024-02-19 ENCOUNTER — Encounter: Payer: Self-pay | Admitting: Family Medicine

## 2024-02-24 ENCOUNTER — Other Ambulatory Visit: Payer: Self-pay | Admitting: Neurology

## 2024-02-24 ENCOUNTER — Telehealth: Payer: Self-pay | Admitting: Neurology

## 2024-02-24 MED ORDER — PROMETHAZINE HCL 25 MG PO TABS: 25.0000 mg | ORAL_TABLET | Freq: Four times a day (QID) | ORAL | 5 refills | Status: AC | PRN

## 2024-02-24 NOTE — Telephone Encounter (Signed)
 Mychart message, Prescription for promethazine  has been sent to [facility] [address]

## 2024-02-24 NOTE — Telephone Encounter (Signed)
 Patient would like to see if Dr Festus Hubert would refill the medication Promethazine  for nausea to help with the migraine. She uses the [facility]  on [address]

## 2024-02-29 ENCOUNTER — Ambulatory Visit: Admitting: Family Medicine

## 2024-02-29 ENCOUNTER — Other Ambulatory Visit: Payer: Self-pay | Admitting: Family Medicine

## 2024-02-29 ENCOUNTER — Encounter: Payer: Self-pay | Admitting: Family Medicine

## 2024-02-29 ENCOUNTER — Ambulatory Visit: Payer: Self-pay | Admitting: Family Medicine

## 2024-02-29 ENCOUNTER — Ambulatory Visit: Attending: Family Medicine

## 2024-02-29 VITALS — BP 116/76 | HR 85 | Temp 97.9°F | Ht 66.0 in | Wt 194.0 lb

## 2024-02-29 DIAGNOSIS — S0990XA Unspecified injury of head, initial encounter: Secondary | ICD-10-CM | POA: Insufficient documentation

## 2024-02-29 DIAGNOSIS — F419 Anxiety disorder, unspecified: Secondary | ICD-10-CM

## 2024-02-29 DIAGNOSIS — Z87898 Personal history of other specified conditions: Secondary | ICD-10-CM

## 2024-02-29 DIAGNOSIS — R55 Syncope and collapse: Secondary | ICD-10-CM

## 2024-02-29 DIAGNOSIS — R002 Palpitations: Secondary | ICD-10-CM | POA: Diagnosis not present

## 2024-02-29 DIAGNOSIS — E559 Vitamin D deficiency, unspecified: Secondary | ICD-10-CM | POA: Diagnosis not present

## 2024-02-29 DIAGNOSIS — G43809 Other migraine, not intractable, without status migrainosus: Secondary | ICD-10-CM | POA: Diagnosis not present

## 2024-02-29 DIAGNOSIS — R071 Chest pain on breathing: Secondary | ICD-10-CM | POA: Diagnosis not present

## 2024-02-29 DIAGNOSIS — R110 Nausea: Secondary | ICD-10-CM | POA: Insufficient documentation

## 2024-02-29 LAB — CBC WITH DIFFERENTIAL/PLATELET
Basophils Absolute: 0 10*3/uL (ref 0.0–0.1)
Basophils Relative: 0.4 % (ref 0.0–3.0)
Eosinophils Absolute: 0.1 10*3/uL (ref 0.0–0.7)
Eosinophils Relative: 2 % (ref 0.0–5.0)
HCT: 39.7 % (ref 36.0–46.0)
Hemoglobin: 13.2 g/dL (ref 12.0–15.0)
Lymphocytes Relative: 21.3 % (ref 12.0–46.0)
Lymphs Abs: 1.2 10*3/uL (ref 0.7–4.0)
MCHC: 33.2 g/dL (ref 30.0–36.0)
MCV: 89.8 fl (ref 78.0–100.0)
Monocytes Absolute: 0.5 10*3/uL (ref 0.1–1.0)
Monocytes Relative: 9.2 % (ref 3.0–12.0)
Neutro Abs: 3.6 10*3/uL (ref 1.4–7.7)
Neutrophils Relative %: 67.1 % (ref 43.0–77.0)
Platelets: 202 10*3/uL (ref 150.0–400.0)
RBC: 4.42 Mil/uL (ref 3.87–5.11)
RDW: 12.7 % (ref 11.5–15.5)
WBC: 5.4 10*3/uL (ref 4.0–10.5)

## 2024-02-29 LAB — TSH: TSH: 2.04 u[IU]/mL (ref 0.35–5.50)

## 2024-02-29 LAB — COMPREHENSIVE METABOLIC PANEL WITH GFR
ALT: 12 U/L (ref 0–35)
AST: 13 U/L (ref 0–37)
Albumin: 4.2 g/dL (ref 3.5–5.2)
Alkaline Phosphatase: 82 U/L (ref 39–117)
BUN: 12 mg/dL (ref 6–23)
CO2: 29 meq/L (ref 19–32)
Calcium: 8.8 mg/dL (ref 8.4–10.5)
Chloride: 104 meq/L (ref 96–112)
Creatinine, Ser: 0.74 mg/dL (ref 0.40–1.20)
GFR: 98.5 mL/min (ref >60.00)
Glucose, Bld: 67 mg/dL — ABNORMAL LOW (ref 70–99)
Potassium: 3.9 meq/L (ref 3.5–5.1)
Sodium: 138 meq/L (ref 135–145)
Total Bilirubin: 0.3 mg/dL (ref 0.2–1.2)
Total Protein: 6.8 g/dL (ref 6.0–8.3)

## 2024-02-29 LAB — VITAMIN D 25 HYDROXY (VIT D DEFICIENCY, FRACTURES): VITD: 15.09 ng/mL — ABNORMAL LOW (ref 30.00–100.00)

## 2024-02-29 LAB — T4, FREE: Free T4: 1.34 ng/dL (ref 0.60–1.60)

## 2024-02-29 LAB — MAGNESIUM: Magnesium: 2 mg/dL (ref 1.5–2.5)

## 2024-02-29 LAB — TROPONIN I (HIGH SENSITIVITY): High Sens Troponin I: <2 ng/L (ref 2–17)

## 2024-02-29 MED ORDER — VITAMIN D (ERGOCALCIFEROL) 1.25 MG (50000 UNIT) PO CAPS: 50000.0000 [IU] | ORAL_CAPSULE | ORAL | 1 refills | Status: DC

## 2024-02-29 NOTE — Progress Notes (Signed)
 Subjective:     Patient ID: Makayla [name], female    DOB: [birth year], 44 y.o.   MRN: [id number]  Chief Complaint  Patient presents with   Fall    5/8 had a fall in the bathroom, blacked out and doesn't remember how she was on the floor. A week after that she started vomiting. Asked psych dr about if it could be due to recent medication changes but she thinks it may be due to fall. Wants pt to rule out concussion and stop meds until she gets evaluated. Last vomited last week, twice in same day but will still have nausea. Head has felt "funny" for a while. Does get the same symptoms with her anxiety so not sure if that    HPI  History of Present Illness         Here with unwitnessed syncopal episode 20 days ago. States she was not seen for this. States she went to work afterwards.   States she was sitting on the toilet on May 8th and her feet fell asleep and when she stood up, her left foot gave away and she fell onto her buttocks. States she had LOC. Woke up and her head was sore in the back. Reports hx of seizures and is unsure if she had a seizure or not.  Reports having trouble finding her words.   No headache, vision changes. No neck pain.   Nausea has improved.   States her psychiatrist increased her medications. She had nausea last week and vomited 2 times on one day. She also experienced dizziness and fatigue.   Fatigue x 1 week.   Her psychiatrist stopped her medication she stopped Prozac  and propranolol yesterday.    She thinks her N/V was associated with her psych medications.     Health Maintenance Due  Topic Date Due   HIV Screening  Never done   Hepatitis C Screening  Never done   Pneumococcal Vaccine 48-84 Years old (1 of 2 - PCV) Never done   Cervical Cancer Screening (HPV/Pap Cotest)  Never done   DTaP/Tdap/Td (2 - Tdap) 09/22/2016    Past Medical History:  Diagnosis Date   Allergy    Anxiety    Asthma    Depression    Hyperlipidemia    IBS  (irritable bowel syndrome)    Migraines    OCD (obsessive compulsive disorder)    TMJ syndrome     Past Surgical History:  Procedure Laterality Date   BIOPSY BREAST Bilateral    TONSILLECTOMY Bilateral    TONSILLECTOMY AND ADENOIDECTOMY      History reviewed. No pertinent family history.  Social History   Socioeconomic History   Marital status: Single    Spouse name: Not on file   Number of children: Not on file   Years of education: Not on file   Highest education level: Bachelor's degree (e.g., BA, AB, BS)  Occupational History   Not on file  Tobacco Use   Smoking status: Never   Smokeless tobacco: Never  Vaping Use   Vaping status: Never Used  Substance and Sexual Activity   Alcohol use: No   Drug use: No   Sexual activity: Not Currently  Other Topics Concern   Not on file  Social History Narrative   Are you right handed or left handed? Right handed   Are you currently employed ? 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 No    Who lives with you? Roommate    What type of home do you live in: 1 story or 2 story? Lives in a two story home       Social Drivers of Health   Financial Resource Strain: High Risk (11/11/2023)   Overall Financial Resource Strain (CARDIA)    Difficulty of Paying Living Expenses: Very hard  Food Insecurity: Unknown (11/11/2023)   Hunger Vital Sign    Worried About Running Out of Food in the Last Year: Patient declined    Ran Out of Food in the Last Year: Never true  Transportation Needs: Unmet Transportation Needs (11/11/2023)   PRAPARE - Administrator, Civil Service (Medical): Yes    Lack of Transportation (Non-Medical): No  Physical Activity: Insufficiently Active (11/11/2023)   Exercise Vital Sign    Days of Exercise per Week: 4 days    Minutes of Exercise per Session: 30 min  Stress: Stress Concern Present (11/11/2023)   [employer] of Occupational Health - Occupational Stress  Questionnaire    Feeling of Stress : Very much  Social Connections: Socially Isolated (11/11/2023)   Social Connection and Isolation Panel [NHANES]    Frequency of Communication with Friends and Family: Once a week    Frequency of Social Gatherings with Friends and Family: Once a week    Attends Religious Services: Never    Database administrator or Organizations: No    Attends Engineer, structural: Not on file    Marital Status: Never married  Intimate Partner Violence: Unknown (01/04/2022)   Received from [employer], [facility]   HITS    Physically Hurt: Not on file    Insult or Talk Down To: Not on file    Threaten Physical Harm: Not on file    Scream or Curse: Not on file    Outpatient Medications Prior to Visit  Medication Sig Dispense Refill   cyclobenzaprine  (FLEXERIL ) 10 MG tablet Take 1 tablet (10 mg total) by mouth 3 (three) times daily as needed for muscle spasms. 90 tablet 5   Erenumab -aooe (AIMOVIG ) 140 MG/ML SOAJ Inject 140 mg into the skin every 28 (twenty-eight) days. 1.12 mL 11   imipramine  (TOFRANIL ) 50 MG tablet Take 1 tablet (50 mg total) by mouth 3 (three) times daily. 90 tablet 1   promethazine  (PHENERGAN ) 25 MG tablet Take 1 tablet (25 mg total) by mouth every 6 (six) hours as needed for nausea or vomiting. 30 tablet 5   rizatriptan  (MAXALT ) 10 MG tablet Take 1 tablet at earliest onset of headache.  May repeat in 2 hours if needed.  Maximum 2 tablets in 24 hours. 10 tablet 5   Vitamin D , Ergocalciferol , (DRISDOL ) 1.25 MG (50000 UNIT) CAPS capsule Take 1 capsule (50,000 Units total) by mouth every 7 (seven) days. 8 capsule 0   FLUoxetine  (PROZAC ) 40 MG capsule Take 40 mg by mouth daily. (Patient not taking: Reported on 02/29/2024)     propranolol (INDERAL) 20 MG tablet Take 20 mg by mouth 3 (three) times daily. (Patient not taking: Reported on 02/29/2024)     busPIRone  (BUSPAR ) 10 MG tablet TAKE 1 TABLET BY MOUTH 3 TIMES A DAY 60 tablet 2   venlafaxine  XR  (EFFEXOR -XR) 150 MG 24 hr capsule TAKE ONE CAPSULE BY MOUTH DAILY WITH BREAKFAST 90 capsule 1   No facility-administered medications prior to visit.    Allergies  Allergen Reactions   Zoloft [Sertraline Hcl]  Review of Systems  Constitutional:  Positive for malaise/fatigue. Negative for chills and fever.  HENT:  Negative for congestion, ear pain, sore throat and tinnitus.   Eyes:  Negative for blurred vision, double vision and photophobia.  Respiratory:  Positive for shortness of breath. Negative for cough, sputum production and wheezing.   Cardiovascular:  Positive for chest pain and palpitations. Negative for leg swelling.  Gastrointestinal:  Positive for nausea and vomiting. Negative for abdominal pain, constipation and diarrhea.  Genitourinary:  Negative for dysuria, frequency and urgency.  Musculoskeletal:  Positive for falls. Negative for back pain and neck pain.  Neurological:  Positive for dizziness and loss of consciousness. Negative for tingling, tremors, focal weakness and headaches.  Psychiatric/Behavioral:  The patient is nervous/anxious.        Objective:     Physical Exam Constitutional:      General: She is not in acute distress.    Appearance: She is not ill-appearing.  HENT:     Head: Atraumatic.     Right Ear: Tympanic membrane and ear canal normal.     Left Ear: Tympanic membrane and ear canal normal.     Nose: Nose normal.     Mouth/Throat:     Mouth: Mucous membranes are moist.     Pharynx: Oropharynx is clear.  Eyes:     Extraocular Movements: Extraocular movements intact.     Conjunctiva/sclera: Conjunctivae normal.     Pupils: Pupils are equal, round, and reactive to light.  Cardiovascular:     Rate and Rhythm: Normal rate and regular rhythm.  Pulmonary:     Effort: Pulmonary effort is normal.     Breath sounds: Normal breath sounds.  Musculoskeletal:        General: Normal range of motion.     Cervical back: Normal range of motion and  neck supple. No tenderness.     Right lower leg: No edema.     Left lower leg: No edema.  Lymphadenopathy:     Cervical: No cervical adenopathy.  Skin:    General: Skin is warm and dry.  Neurological:     General: No focal deficit present.     Mental Status: She is alert and oriented to person, place, and time.     Cranial Nerves: No cranial nerve deficit or facial asymmetry.     Sensory: No sensory deficit.     Motor: No weakness, tremor or pronator drift.     Coordination: Coordination is intact. Romberg sign negative. Coordination normal.     Gait: Gait normal.  Psychiatric:        Attention and Perception: Attention normal.        Mood and Affect: Affect normal. Mood is anxious.        Speech: Speech normal.        Behavior: Behavior normal.        Thought Content: Thought content normal.      BP 116/76 (BP Location: Left Arm, Patient Position: Sitting)   Pulse 85   Temp 97.9 F (36.6 C) (Temporal)   Ht 5\' 6"  (1.676 m)   Wt 194 lb (88 kg)   SpO2 99%   BMI 31.31 kg/m  Wt Readings from Last 3 Encounters:  02/29/24 194 lb (88 kg)  11/11/23 190 lb (86.2 kg)  10/11/23 180 lb (81.6 kg)       Assessment & Plan:   Problem List Items Addressed This Visit     Anxiety   Relevant Medications  FLUoxetine  (PROZAC ) 40 MG capsule   Chest pain on breathing   Relevant Orders   CBC with Differential/Platelet   Comprehensive metabolic panel with GFR   TSH   T4, free   Magnesium   Troponin I (High Sensitivity) (Completed)   Chronic nausea   Head injury   Relevant Orders   MR BRAIN W WO CONTRAST   History of seizure   Relevant Orders   MR BRAIN W WO CONTRAST   Migraines   Relevant Medications   FLUoxetine  (PROZAC ) 40 MG capsule   propranolol (INDERAL) 20 MG tablet   Palpitations   Relevant Orders   CBC with Differential/Platelet   Comprehensive metabolic panel with GFR   TSH   T4, free   Magnesium   Troponin I (High Sensitivity) (Completed)   LONG TERM  MONITOR (3-14 DAYS)   Syncope and collapse - Primary   Relevant Orders   MR BRAIN W WO CONTRAST   CBC with Differential/Platelet   Comprehensive metabolic panel with GFR   TSH   T4, free   Troponin I (High Sensitivity) (Completed)   LONG TERM MONITOR (3-14 DAYS)   Vitamin D  deficiency   Relevant Orders   VITAMIN D  25 Hydroxy (Vit-D Deficiency, Fractures)   Here with complaints of unwitnessed syncopal episode with fall and head injury 20 days ago.  Since then she has had intermittent dizziness, nausea and episodes of vomiting.  Reports history of seizures.  Stat MRI brain ordered for evaluation.  No red flag symptoms today. Benign neuro exam.  EKG shows NSR with rate 84, no acute changes compared to previous EKG. Declines GI referral for chronic nausea and states nausea is related to her anxiety. Check labs to look for underlying etiology for palpitations. Long-term event monitor, Zio patch, ordered. Troponin negative. Check vitamin D  level. Advised her to reach out to her psychiatrist to restart anxiety medications.  Anxiety appears to be playing a role in her symptoms.  I have discontinued Devra Fontana A. Wawrzyniak's venlafaxine  XR and busPIRone . I am also having her maintain her Vitamin D  (Ergocalciferol ), cyclobenzaprine , Aimovig , rizatriptan , imipramine , promethazine , FLUoxetine , and propranolol.  No orders of the defined types were placed in this encounter.

## 2024-02-29 NOTE — Patient Instructions (Signed)
 Please go downstairs for labs before you leave.  I ordered an MRI of your brain and you will receive a call to get this done.  If you have another syncopal episode or have chest pain, heart racing or shortness of breath, please call 911 or go to the emergency department.  Please discuss restarting your anxiety medication with your psychiatrist as soon as possible.

## 2024-02-29 NOTE — Progress Notes (Unsigned)
 EP to read.

## 2024-03-02 NOTE — Addendum Note (Signed)
 Addended by: Wacey Zieger E on: 03/02/2024 11:46 AM   Modules accepted: Orders

## 2024-03-05 ENCOUNTER — Other Ambulatory Visit: Payer: Self-pay | Admitting: Neurology

## 2024-03-08 ENCOUNTER — Telehealth: Payer: Self-pay | Admitting: Neurology

## 2024-03-08 MED ORDER — IMIPRAMINE HCL 50 MG PO TABS: 50.0000 mg | ORAL_TABLET | Freq: Three times a day (TID) | ORAL | 0 refills | Status: DC

## 2024-03-08 NOTE — Telephone Encounter (Signed)
 Pt needs refill request was sent to wrng doctor on [facility] [address], Rx IMIPRAMINE  to Dianna Fortis, mychart confirmation once complete

## 2024-03-09 DIAGNOSIS — F330 Major depressive disorder, recurrent, mild: Secondary | ICD-10-CM | POA: Diagnosis not present

## 2024-03-09 DIAGNOSIS — F411 Generalized anxiety disorder: Secondary | ICD-10-CM | POA: Diagnosis not present

## 2024-03-09 DIAGNOSIS — F429 Obsessive-compulsive disorder, unspecified: Secondary | ICD-10-CM | POA: Diagnosis not present

## 2024-03-09 NOTE — Progress Notes (Signed)
 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  Continue Flexeril  and promethazine   Limit use of pain relievers to no more than 9 days out of the month to prevent risk of rebound or medication-overuse headache. Keep headache diary Follow up 6 months.    Subjective:  Makayla [name] is a 43 year old right-handed female with depression, anxiety, OCD, asthma, IBS, and intermittent TMJ dysfunction who follows up for headaches and seizures.  UPDATE: Migraines/Tension-type headaches: With appeal, patient was approved to start Aimovig .  However, she only took one dose because she doesn't want to be on an expensive medication in case she loses her insurance. Intensity:  moderate to severe Duration:  3 days  (tried rizatriptan  once, still 3 hours) Frequency:  tension type headaches once a week (migraines once a month)   Current medications: Current NSAIDS/analgesics:  Extra-strength Tylenol (usually once a week to every other week) Current triptans:  rizatriptan  10mg  Current ergotamine:  none Current anti-emetic:  promethazine  25mg  Current muscle relaxants:  cyclobenzaprine  10mg  Current Antihypertensive medications:  none Current Antidepressant medications:  fluoxetine  20mg  daily, imipramine  50mg  TID Current Anticonvulsant medications:  none Current anti-CGRP: none Other therapy:  none Other medications:  buspirone   In early May, she was in the bathroom and her foot fell asleep.  She stood up and took a step and fell, striking her head.  She did lose consciousness briefly.  She had nausea and vomiting afterwards as  well as occasional word-finding difficulty.  Resolved.  Able to work.  Her PCP ordered an MRI of the brain.     Caffeine:  Soda daily.  No coffee Alcohol:  just once or twice a year Smoker:  no Diet:  16 oz water daily.  Juice.  Cut down on soda.  Does not skip meals. Exercise:  not routine Depression:  yes; Anxiety:  yes Other pain:  bilateral knee pain Sleep hygiene: She has an appointment with a sleep specialist to assess for sleep apnea.  HISTORY: Migraine without aura & Tension-headaches: Started 2005.  Starts in shoulders and neck bilaterally and wraps around head.  Moderate but may progress to severe.  Squeezing, sometimes feels like she was hit in back of head with baseball bat.  N/V when severe, photo, phono, Can't get comfortable. Relieving:  ice pack, vomiting,  usually last severe:  several days, moderate-once sleep it off.  Frequency:  once a week, severe onces once a month lasting 4 days  Treats with Flexeril  and Biofreeze to face, neck and shoulders.  Triggers - hunger, stress, prolonged screen time  History of seizures- Presumed PNES: After 2005-2007 Suspect psychogenic - stress related to job.  2 black outs.  Black out of vision and start shaking and then will drop to floor in crouching position to wait it out.  Hittign head on floor repeatedly.  Found self on floor in bedroom and not remember how got there.  Another time, she fell and hit head on bathroom sink.  Treated for anxiety with Xanax  and resolved and quit stressful job.  EEG and MRI negative.  She had a similar episode after taken off of Xanax  last year - brief episode  of visoin going black but no LOC.  Past medications: Past NSAIDS/analgesics:  ketorolac , ibuprofen, naproxen  Past abortive triptans:  none Past abortive ergotamine:  none Past muscle relaxants:  none Past anti-emetic:  none Past antihypertensive medications:  propranolol Past antidepressant medications:  venlafaxine  XR 150mg , citalopram ,  fluoxetine  Past anticonvulsant medications:  topiramate Past anti-CGRP:  none Past vitamins/Herbal/Supplements:  none Past antihistamines/decongestants:  none Other past therapies:  trigger point injections.    Family history of headache:  mom (migraines)  PAST MEDICAL HISTORY: Past Medical History:  Diagnosis Date   Allergy    Anxiety    Asthma    Depression    Hyperlipidemia    IBS (irritable bowel syndrome)    Migraines    OCD (obsessive compulsive disorder)    TMJ syndrome     MEDICATIONS: Current Outpatient Medications on File Prior to Visit  Medication Sig Dispense Refill   cyclobenzaprine  (FLEXERIL ) 10 MG tablet Take 1 tablet (10 mg total) by mouth 3 (three) times daily as needed for muscle spasms. 90 tablet 5   Erenumab -aooe (AIMOVIG ) 140 MG/ML SOAJ Inject 140 mg into the skin every 28 (twenty-eight) days. 1.12 mL 11   FLUoxetine  (PROZAC ) 40 MG capsule Take 40 mg by mouth daily. (Patient not taking: Reported on 02/29/2024)     imipramine  (TOFRANIL ) 50 MG tablet Take 1 tablet (50 mg total) by mouth 3 (three) times daily. 90 tablet 0   promethazine  (PHENERGAN ) 25 MG tablet Take 1 tablet (25 mg total) by mouth every 6 (six) hours as needed for nausea or vomiting. 30 tablet 5   propranolol (INDERAL) 20 MG tablet Take 20 mg by mouth 3 (three) times daily. (Patient not taking: Reported on 02/29/2024)     rizatriptan  (MAXALT ) 10 MG tablet Take 1 tablet at earliest onset of headache.  May repeat in 2 hours if needed.  Maximum 2 tablets in 24 hours. 10 tablet 5   Vitamin D , Ergocalciferol , (DRISDOL ) 1.25 MG (50000 UNIT) CAPS capsule Take 1 capsule (50,000 Units total) by mouth every 7 (seven) days. 6 capsule 1   No current facility-administered medications on file prior to visit.    ALLERGIES: Allergies  Allergen Reactions   Zoloft [Sertraline Hcl]     FAMILY HISTORY: No family history on file.    Objective:  Blood pressure 108/68, pulse 84, height 5\' 6"  (1.676 m),  weight 196 lb 12.8 oz (89.3 kg), SpO2 99%. General: No acute distress.  Patient appears well-groomed.   Head:  Normocephalic/atraumatic Eyes:  Fundi examined but not visualized Neck: supple, no paraspinal tenderness, full range of motion Heart:  Regular rate and rhythm Lungs:  Clear to auscultation bilaterally Back: No paraspinal tenderness Neurological Exam: alert and oriented.  Speech fluent and not dysarthric, language intact.  CN II-XII intact. Bulk and tone normal, muscle strength 5/5 throughout.  Sensation to light touch intact.  Deep tendon reflexes 2+ throughout, toes downgoing.  Finger to nose testing intact.  Gait normal, able to walk in tandem.  Romberg negative.   Janne Members, DO  CC: Alyson Back, NP-C

## 2024-03-11 NOTE — Progress Notes (Deleted)
 03/13/24- 43 yoF for sleep evaluation courtesy of Dr Festus Hubert Neurology with concern of somnolence, headache Medical problem list includes Insomnia, OCD, Anxiety/Depression, Migraine, Asthma, IBS, TMJ,  Epworth score- Body weight today-

## 2024-03-12 ENCOUNTER — Ambulatory Visit (INDEPENDENT_AMBULATORY_CARE_PROVIDER_SITE_OTHER): Payer: Medicaid Other | Admitting: Neurology

## 2024-03-12 ENCOUNTER — Encounter: Payer: Self-pay | Admitting: Neurology

## 2024-03-12 VITALS — BP 108/68 | HR 84 | Ht 66.0 in | Wt 196.8 lb

## 2024-03-12 DIAGNOSIS — G43009 Migraine without aura, not intractable, without status migrainosus: Secondary | ICD-10-CM

## 2024-03-13 ENCOUNTER — Ambulatory Visit: Admitting: Internal Medicine

## 2024-03-19 ENCOUNTER — Encounter: Payer: Self-pay | Admitting: Neurology

## 2024-03-19 ENCOUNTER — Ambulatory Visit: Payer: Medicaid Other | Admitting: Neurology

## 2024-03-21 ENCOUNTER — Encounter (HOSPITAL_BASED_OUTPATIENT_CLINIC_OR_DEPARTMENT_OTHER): Payer: Self-pay

## 2024-03-26 ENCOUNTER — Other Ambulatory Visit

## 2024-04-03 ENCOUNTER — Other Ambulatory Visit: Payer: Self-pay | Admitting: Neurology

## 2024-04-13 DIAGNOSIS — F330 Major depressive disorder, recurrent, mild: Secondary | ICD-10-CM | POA: Diagnosis not present

## 2024-04-13 DIAGNOSIS — F429 Obsessive-compulsive disorder, unspecified: Secondary | ICD-10-CM | POA: Diagnosis not present

## 2024-04-13 DIAGNOSIS — F411 Generalized anxiety disorder: Secondary | ICD-10-CM | POA: Diagnosis not present

## 2024-04-20 ENCOUNTER — Encounter: Payer: Self-pay | Admitting: Family Medicine

## 2024-04-23 NOTE — Addendum Note (Signed)
 Addended by: Kala Gassmann E on: 04/23/2024 01:14 PM   Modules accepted: Orders

## 2024-05-01 ENCOUNTER — Other Ambulatory Visit: Payer: Self-pay

## 2024-05-01 ENCOUNTER — Ambulatory Visit (HOSPITAL_COMMUNITY): Admission: EM | Admit: 2024-05-01 | Discharge: 2024-05-01 | Disposition: A | Discharge status: Home / Self Care

## 2024-05-01 ENCOUNTER — Encounter (HOSPITAL_COMMUNITY): Payer: Self-pay | Admitting: Emergency Medicine

## 2024-05-01 DIAGNOSIS — R519 Headache, unspecified: Secondary | ICD-10-CM | POA: Diagnosis not present

## 2024-05-01 NOTE — Discharge Instructions (Signed)
 Please pick up your medication and take as prescribed by your neurologist Dr. Ena for further evaluation and management of your headaches Follow-up with your PCP as needed Avoid excessive caffeine intake,  drink plenty of water, keep a headache log, go to Er for new or worsening issues or concerns.

## 2024-05-01 NOTE — ED Triage Notes (Signed)
 Pt states she is having a HA that started this morning at 3:30 am after she got a shower with light sensitivity, pt denies any dizziness or visual changes at this time. Pt is AO x 4 no neuro deficit noticed. Pt states she took some extra strength  Tylenol with minimal relief.

## 2024-05-01 NOTE — ED Provider Notes (Signed)
 [facility]    CSN: [id number] Arrival date & time: 05/01/24  1108      History   Chief Complaint Chief Complaint  Patient presents with   Headache    HPI Makayla [name] is a 43 y.o. female.   Makayla [name], 43 year old female comes to urgent care for evaluation of headache that started approximately 330 this morning after she had a shower, patient reports light sensitivity, reports 8/10 HA to posterior aspect, radiating down/up neck,  patient denies any dizziness or visual changes at this time GCS 15 . Took tylenol with minimal relief  PMH: depression, anxiety, OCD, asthma, IBS, and intermittent TMJ dysfunction who follows up for headaches and seizures.  The history is provided by the patient. No language interpreter was used.    Past Medical History:  Diagnosis Date   Allergy    Anxiety    Asthma    Depression    Hyperlipidemia    IBS (irritable bowel syndrome)    Migraines    OCD (obsessive compulsive disorder)    TMJ syndrome     Patient Active Problem List   Diagnosis Date Noted   Syncope and collapse 02/29/2024   Head injury 02/29/2024   Chronic nausea 02/29/2024   Palpitations 02/29/2024   Chest pain on breathing 02/29/2024   Fatigue 09/09/2023   Abnormal uterine bleeding (AUB) 09/09/2023   Vitamin D  deficiency 09/09/2023   History of seizure 09/09/2023   Headache disorder 04/03/2021   Medication management 02/24/2016   Asthma    IBS (irritable bowel syndrome)    Migraines    TMJ syndrome    Anxiety and depression    Anxiety    OCD (obsessive compulsive disorder)     Past Surgical History:  Procedure Laterality Date   BIOPSY BREAST Bilateral    TONSILLECTOMY Bilateral    TONSILLECTOMY AND ADENOIDECTOMY      OB History   No obstetric history on file.      Home Medications    Prior to Admission medications   Medication Sig Start Date End Date Taking? Authorizing Provider  cyclobenzaprine  (FLEXERIL ) 10 MG tablet  Take 1 tablet (10 mg total) by mouth 3 (three) times daily as needed for muscle spasms. 09/27/23   Skeet Juliene SAUNDERS, DO  FLUoxetine  (PROZAC ) 20 MG capsule Take 20 mg by mouth daily. 01/27/24   [provider]  imipramine  (TOFRANIL ) 50 MG tablet TAKE 1 TABLET BY MOUTH 3 TIMES A DAY 04/04/24   Skeet, Adam R, DO  promethazine  (PHENERGAN ) 25 MG tablet Take 1 tablet (25 mg total) by mouth every 6 (six) hours as needed for nausea or vomiting. 02/24/24   Skeet, Adam R, DO  rizatriptan  (MAXALT ) 10 MG tablet Take 1 tablet at earliest onset of headache.  May repeat in 2 hours if needed.  Maximum 2 tablets in 24 hours. 09/27/23   Skeet Juliene SAUNDERS, DO  Vitamin D , Ergocalciferol , (DRISDOL ) 1.25 MG (50000 UNIT) CAPS capsule Take 1 capsule (50,000 Units total) by mouth every 7 (seven) days. 02/29/24   Lendia Boby CROME, NP-C    Family History History reviewed. No pertinent family history.  Social History Social History   Tobacco Use   Smoking status: Never   Smokeless tobacco: Never  Vaping Use   Vaping status: Never Used  Substance Use Topics   Alcohol use: No   Drug use: No     Allergies   Zoloft [sertraline hcl]   Review of Systems Review  of Systems  Constitutional:  Negative for fever.  Neurological:  Positive for headaches. Negative for dizziness, syncope, weakness and numbness.  All other systems reviewed and are negative.    Physical Exam Triage Vital Signs ED Triage Vitals [05/01/24 1122]  Encounter Vitals Group     BP 135/85     Girls Systolic BP Percentile      Girls Diastolic BP Percentile      Boys Systolic BP Percentile      Boys Diastolic BP Percentile      Pulse Rate 80     Resp 16     Temp 98.5 F (36.9 C)     Temp Source Oral     SpO2 98 %     Weight      Height      Head Circumference      Peak Flow      Pain Score      Pain Loc      Pain Education      Exclude from Growth Chart    No data found.  Updated Vital Signs BP 135/85 (BP Location: Right Arm)    Pulse 80   Temp 98.5 F (36.9 C) (Oral)   Resp 16   LMP 04/26/2024   SpO2 98%   Visual Acuity Right Eye Distance:   Left Eye Distance:   Bilateral Distance:    Right Eye Near:   Left Eye Near:    Bilateral Near:     Physical Exam Vitals and nursing note reviewed.  Constitutional:      Appearance: Normal appearance. She is well-developed and well-groomed.  HENT:     Head: Normocephalic.  Cardiovascular:     Rate and Rhythm: Normal rate.  Pulmonary:     Effort: Pulmonary effort is normal.  Neurological:     Mental Status: She is alert.  Psychiatric:        Behavior: Behavior is cooperative.      UC Treatments / Results  Labs (all labs ordered are listed, but only abnormal results are displayed) Labs Reviewed - No data to display  EKG   Radiology No results found.  Procedures Procedures (including critical care time)  Medications Ordered in UC Medications - No data to display  Initial Impression / Assessment and Plan / UC Course  I have reviewed the triage vital signs and the nursing notes.  Pertinent labs & imaging results that were available during my care of the patient were reviewed by me and considered in my medical decision making (see chart for details).    Discussed exam findings and plan of care with patient, strict go to ER precautions given.   Patient verbalized understanding to this provider.  Ddx: Headache disorder,migraine, tension HA Final Clinical Impressions(s) / UC Diagnoses   Final diagnoses:  Headache disorder     Discharge Instructions      Please pick up your medication and take as prescribed by your neurologist Dr. Ena for further evaluation and management of your headaches Follow-up with your PCP as needed Avoid excessive caffeine intake,  drink plenty of water, keep a headache log, go to Er for new or worsening issues or concerns.      ED Prescriptions   None    PDMP not reviewed this encounter.    Aminta Loose, NP 05/01/24 1425

## 2024-05-10 ENCOUNTER — Ambulatory Visit: Admitting: Family Medicine

## 2024-05-11 ENCOUNTER — Other Ambulatory Visit

## 2024-05-14 ENCOUNTER — Ambulatory Visit: Admitting: Internal Medicine

## 2024-05-15 ENCOUNTER — Ambulatory Visit: Admitting: Internal Medicine

## 2024-05-18 ENCOUNTER — Ambulatory Visit: Admitting: Family Medicine

## 2024-05-24 ENCOUNTER — Ambulatory Visit: Admitting: Family Medicine

## 2024-05-25 ENCOUNTER — Other Ambulatory Visit: Payer: Self-pay | Admitting: Family Medicine

## 2024-05-29 ENCOUNTER — Other Ambulatory Visit: Payer: Self-pay | Admitting: Family Medicine

## 2024-05-29 ENCOUNTER — Other Ambulatory Visit

## 2024-05-30 ENCOUNTER — Encounter: Payer: Self-pay | Admitting: Family Medicine

## 2024-05-30 ENCOUNTER — Ambulatory Visit
Admission: RE | Admit: 2024-05-30 | Discharge: 2024-05-30 | Disposition: A | Discharge status: Home / Self Care | Source: Ambulatory Visit | Attending: Family Medicine | Admitting: Family Medicine

## 2024-05-30 DIAGNOSIS — S0990XA Unspecified injury of head, initial encounter: Secondary | ICD-10-CM

## 2024-05-30 DIAGNOSIS — R410 Disorientation, unspecified: Secondary | ICD-10-CM | POA: Diagnosis not present

## 2024-05-30 DIAGNOSIS — R55 Syncope and collapse: Secondary | ICD-10-CM | POA: Diagnosis not present

## 2024-05-30 DIAGNOSIS — Z87898 Personal history of other specified conditions: Secondary | ICD-10-CM

## 2024-05-30 MED ORDER — GADOPICLENOL 0.5 MMOL/ML IV SOLN
8.0000 mL | Freq: Once | INTRAVENOUS | Status: AC | PRN
  Administered 2024-05-30: 8 mL via INTRAVENOUS

## 2024-05-30 NOTE — Telephone Encounter (Signed)
 error

## 2024-06-11 DIAGNOSIS — F429 Obsessive-compulsive disorder, unspecified: Secondary | ICD-10-CM | POA: Diagnosis not present

## 2024-06-11 DIAGNOSIS — F330 Major depressive disorder, recurrent, mild: Secondary | ICD-10-CM | POA: Diagnosis not present

## 2024-06-11 DIAGNOSIS — F411 Generalized anxiety disorder: Secondary | ICD-10-CM | POA: Diagnosis not present

## 2024-06-14 ENCOUNTER — Encounter: Payer: Self-pay | Admitting: Nurse Practitioner

## 2024-06-14 ENCOUNTER — Ambulatory Visit (INDEPENDENT_AMBULATORY_CARE_PROVIDER_SITE_OTHER): Admitting: Nurse Practitioner

## 2024-06-14 VITALS — BP 134/74 | HR 78 | Temp 98.0°F | Ht 66.0 in | Wt 193.0 lb

## 2024-06-14 DIAGNOSIS — G4719 Other hypersomnia: Secondary | ICD-10-CM | POA: Diagnosis not present

## 2024-06-14 DIAGNOSIS — J329 Chronic sinusitis, unspecified: Secondary | ICD-10-CM | POA: Diagnosis not present

## 2024-06-14 DIAGNOSIS — R0683 Snoring: Secondary | ICD-10-CM | POA: Diagnosis not present

## 2024-06-14 NOTE — Patient Instructions (Addendum)
 Given your symptoms, I am concerned that you may have sleep disordered breathing with sleep apnea. You will need a sleep study for further evaluation. Someone will contact you to schedule this.   We discussed how untreated sleep apnea puts an individual at risk for cardiac arrhthymias, pulm HTN, DM, stroke and increases their risk for daytime accidents. We also briefly reviewed treatment options including weight loss, side sleeping position, oral appliance, CPAP therapy or referral to ENT for possible surgical options  Use caution when driving and pull over if you become sleepy.  Flonase nasal spray 2 sprays each nostril at bedtime   Follow up in 6 weeks with Katie Terion Hedman,NP to go over sleep study results, or sooner, if needed. Friday PM virtual clinic preferred

## 2024-06-14 NOTE — Assessment & Plan Note (Signed)
 She has snoring, excessive daytime sleepiness, morning headaches, restless sleep. BMI 31. Given this,  I am concerned she could have sleep disordered breathing with obstructive sleep apnea. She will need sleep study for further evaluation.    - discussed how weight can impact sleep and risk for sleep disordered breathing - discussed options to assist with weight loss: combination of diet modification, cardiovascular and strength training exercises   - had an extensive discussion regarding the adverse health consequences related to untreated sleep disordered breathing - specifically discussed the risks for hypertension, coronary artery disease, cardiac dysrhythmias, cerebrovascular disease, and diabetes - lifestyle modification discussed   - discussed how sleep disruption can increase risk of accidents, particularly when driving - safe driving practices were discussed  Patient Instructions  Given your symptoms, I am concerned that you may have sleep disordered breathing with sleep apnea. You will need a sleep study for further evaluation. Someone will contact you to schedule this.   We discussed how untreated sleep apnea puts an individual at risk for cardiac arrhthymias, pulm HTN, DM, stroke and increases their risk for daytime accidents. We also briefly reviewed treatment options including weight loss, side sleeping position, oral appliance, CPAP therapy or referral to ENT for possible surgical options  Use caution when driving and pull over if you become sleepy.  Flonase nasal spray 2 sprays each nostril at bedtime   Follow up in 6 weeks with [name],NP to go over sleep study results, or sooner, if needed. Friday PM virtual clinic preferred

## 2024-06-14 NOTE — Assessment & Plan Note (Signed)
 See above.

## 2024-06-14 NOTE — Progress Notes (Signed)
 @Patient  ID: Makayla [name], female    DOB: January 30, 1981, 43 y.o.   MRN: [id number]  Chief Complaint  Patient presents with   Consult    Excessive daytime sleepiness     Referring provider: Skeet Juliene SAUNDERS, DO  HPI: 43 year old female, never smoker referred for sleep consult. Past medical history significant for migraines, asthma, IBS, OCD, anxiety, depression.   TEST/EVENTS:   06/14/2024: Today - sleep consult Discussed the use of AI scribe software for clinical note transcription with the patient, who gave verbal consent to proceed.  History of Present Illness Makayla [name] is a 43 year old female who presents with excessive daytime sleepiness and snoring for a sleep consult.  She experiences excessive daytime sleepiness, often waking up feeling exhausted and not well-rested regardless of sleep duration. She usually does not wake up during the night, but occasionally she might without remembering it. She takes Imipramine  at night, which usually makes her tired.  She experiences morning headaches occasionally and has been told by a roommate that she snores loudly. However, she has not been told that she stops breathing during sleep, nor does she wake up choking or gasping for air. No issues with drowsy driving.  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 No recent sleep parasomnias. She recalls visiting an ear, nose, and throat doctor in elementary school, who noted that one of her nasal passages appeared to be collapsing, but she did not undergo any surgery.  She experiences morning congestion, waking up congested despite going to bed without symptoms. She has not used nasal sprays for a long time and had allergies prior to 2015. No daytime issues with sinuses.   Epworth 3    Allergies  Allergen Reactions   Zoloft [Sertraline Hcl]     Immunization History   Administered Date(s) Administered   Td 09/22/2006    Past Medical History:  Diagnosis Date   Allergy    Anxiety    Asthma    Depression    Hyperlipidemia    IBS (irritable bowel syndrome)    Migraines    OCD (obsessive compulsive disorder)    TMJ syndrome     Tobacco History: Social History   Tobacco Use  Smoking Status Never  Smokeless Tobacco Never   Counseling given: Not Answered   Outpatient Medications Prior to Visit  Medication Sig Dispense Refill   cyclobenzaprine  (FLEXERIL ) 10 MG tablet Take 1 tablet (10 mg total) by mouth 3 (three) times daily as needed for muscle spasms. 90 tablet 5   imipramine  (TOFRANIL ) 50 MG tablet TAKE 1 TABLET BY MOUTH 3 TIMES A DAY 90 tablet 5   promethazine  (PHENERGAN ) 25 MG tablet Take 1 tablet (25 mg total) by mouth every 6 (six) hours as needed for nausea or vomiting. 30 tablet 5   rizatriptan  (MAXALT ) 10 MG tablet Take 1 tablet at earliest onset of headache.  May repeat in 2 hours if needed.  Maximum 2 tablets in 24 hours. 10 tablet 5   VRAYLAR 1.5 MG capsule Take 1.5 mg by mouth daily.     FLUoxetine  (PROZAC ) 20 MG capsule Take 20 mg by mouth daily.     Vitamin D , Ergocalciferol , (DRISDOL ) 1.25 MG (50000 UNIT) CAPS capsule Take 1 capsule (50,000 Units total) by mouth every 7 (seven) days. 6 capsule 1   No facility-administered medications prior to visit.  Review of Systems: As above    Physical Exam:  BP 134/74   Pulse 78   Temp 98 F (36.7 C) (Oral)   Ht 5' 6 (1.676 m)   Wt 193 lb (87.5 kg)   SpO2 95%   BMI 31.15 kg/m   GEN: Pleasant, interactive, well-appearing; obese; in no acute distress HEENT:  Normocephalic and atraumatic. PERRLA. Sclera white. Nasal turbinates pink, moist and patent bilaterally. No rhinorrhea present. Oropharynx pink and moist, without exudate or edema. No lesions, ulcerations, or postnasal drip. Mallampati III/IV NECK:  Supple w/ fair ROM. No lymphadenopathy.   CV: RRR, no m/r/g, no  peripheral edema. Pulses intact, +2 bilaterally. No cyanosis, pallor or clubbing. PULMONARY:  Unlabored, regular breathing. Clear bilaterally A&P w/o wheezes/rales/rhonchi. No accessory muscle use.  GI: BS present and normoactive. Soft, non-tender to palpation. No organomegaly or masses detected. MSK: No erythema, warmth or tenderness. Cap refil <2 sec all extrem. N Neuro: A/Ox3. No focal deficits noted.   Skin: Warm, no lesions or rashe Psych: Normal affect and behavior. Judgement and thought content appropriate.     Lab Results:  CBC    Component Value Date/Time   WBC 5.4 02/29/2024 1055   RBC 4.42 02/29/2024 1055   HGB 13.2 02/29/2024 1055   HGB 13.3 06/29/2023 0951   HCT 39.7 02/29/2024 1055   HCT 41.5 06/29/2023 0951   PLT 202.0 02/29/2024 1055   PLT 239 06/29/2023 0951   MCV 89.8 02/29/2024 1055   MCV 94 06/29/2023 0951   MCH 30.2 06/29/2023 0951   MCH 30.9 01/27/2022 1433   MCHC 33.2 02/29/2024 1055   RDW 12.7 02/29/2024 1055   RDW 11.7 06/29/2023 0951   LYMPHSABS 1.2 02/29/2024 1055   MONOABS 0.5 02/29/2024 1055   EOSABS 0.1 02/29/2024 1055   BASOSABS 0.0 02/29/2024 1055    BMET    Component Value Date/Time   NA 138 02/29/2024 1055   NA 139 06/29/2023 0951   K 3.9 02/29/2024 1055   CL 104 02/29/2024 1055   CO2 29 02/29/2024 1055   GLUCOSE 67 (L) 02/29/2024 1055   BUN 12 02/29/2024 1055   BUN 5 (L) 06/29/2023 0951   CREATININE 0.74 02/29/2024 1055   CREATININE 0.74 08/14/2020 1016   CALCIUM 8.8 02/29/2024 1055   GFRNONAA >60 01/27/2022 1433   GFRNONAA 101 08/14/2020 1016   GFRAA 117 08/14/2020 1016    BNP No results found for: BNP   Imaging:  MR BRAIN W WO CONTRAST Result Date: 05/30/2024 EXAM: MRI BRAIN WITH AND WITHOUT CONTRAST 05/30/2024 11:13:34 AM TECHNIQUE: Multiplanar multisequence MRI of the head/brain was performed with and without the administration of intravenous contrast. COMPARISON: None available. CLINICAL HISTORY:  Syncope/presyncope, cerebrovascular cause suspected. Pt hx of blacking out and falling X 3 months; Increased confusion/difficulty talking. FINDINGS: The study is intermittently up to moderately motion degraded. BRAIN AND VENTRICLES: Few punctate foci of T2 FLAIR hyperintensity/nonspecific gliosis in the cerebral white matter, not considered atypical for age. Thin section temporal lobe imaging is motion degraded but demonstrates grossly symmetric size and signal of the hippocampi. No acute infarct. No acute intracranial hemorrhage. No mass effect or midline shift. No hydrocephalus. The sella is unremarkable. Normal flow voids. No mass or abnormal enhancement. ORBITS: No acute abnormality. SINUSES: Minimal mucosal thickening in the paranasal sinuses. Clear mastoid air cells. BONES AND SOFT TISSUES: Normal bone marrow signal and enhancement. No acute soft tissue abnormality. IMPRESSION: 1. No acute intracranial abnormality. Unremarkable appearance of the brain for  age on this motion-degraded examination. Electronically signed by: Dasie Hamburg MD 05/30/2024 11:21 AM EDT RP Workstation: [id number]    Administration History     None           No data to display          No results found for: NITRICOXIDE      Assessment & Plan:   Excessive daytime sleepiness She has snoring, excessive daytime sleepiness, morning headaches, restless sleep. BMI 31. Given this,  I am concerned she could have sleep disordered breathing with obstructive sleep apnea. She will need sleep study for further evaluation.    - discussed how weight can impact sleep and risk for sleep disordered breathing - discussed options to assist with weight loss: combination of diet modification, cardiovascular and strength training exercises   - had an extensive discussion regarding the adverse health consequences related to untreated sleep disordered breathing - specifically discussed the risks for hypertension, coronary artery  disease, cardiac dysrhythmias, cerebrovascular disease, and diabetes - lifestyle modification discussed   - discussed how sleep disruption can increase risk of accidents, particularly when driving - safe driving practices were discussed  Patient Instructions  Given your symptoms, I am concerned that you may have sleep disordered breathing with sleep apnea. You will need a sleep study for further evaluation. Someone will contact you to schedule this.   We discussed how untreated sleep apnea puts an individual at risk for cardiac arrhthymias, pulm HTN, DM, stroke and increases their risk for daytime accidents. We also briefly reviewed treatment options including weight loss, side sleeping position, oral appliance, CPAP therapy or referral to ENT for possible surgical options  Use caution when driving and pull over if you become sleepy.  Flonase nasal spray 2 sprays each nostril at bedtime   Follow up in 6 weeks with Katie Denario Bagot,NP to go over sleep study results, or sooner, if needed. Friday PM virtual clinic preferred     Loud snoring See above  Chronic sinusitis Start intranasal steroid with flonase nasal spray 2 sprays each nostril daily. Will refer to ENT if no improvement   Advised if symptoms do not improve or worsen, to please contact office for sooner follow up or seek emergency care.   I spent 35 minutes of dedicated to the care of this patient on the date of this encounter to include pre-visit review of records, face-to-face time with the patient discussing conditions above, post visit ordering of testing, clinical documentation with the electronic health record, making appropriate referrals as documented, and communicating necessary findings to members of the patients care team.  Comer LULLA Rouleau, NP 06/14/2024  Pt aware and understands NP's role.

## 2024-06-14 NOTE — Assessment & Plan Note (Signed)
 Start intranasal steroid with flonase nasal spray 2 sprays each nostril daily. Will refer to ENT if no improvement

## 2024-06-21 ENCOUNTER — Other Ambulatory Visit (HOSPITAL_COMMUNITY): Payer: Self-pay

## 2024-07-06 ENCOUNTER — Telehealth: Payer: Self-pay
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
## 2024-07-07 ENCOUNTER — Ambulatory Visit (HOSPITAL_COMMUNITY)
Admission: RE | Admit: 2024-07-07 | Discharge: 2024-07-07 | Disposition: A | Discharge status: Home / Self Care | Payer: Self-pay | Source: Ambulatory Visit

## 2024-07-07 ENCOUNTER — Other Ambulatory Visit: Payer: Self-pay | Admitting: Family Medicine

## 2024-07-07 ENCOUNTER — Encounter (HOSPITAL_COMMUNITY): Payer: Self-pay

## 2024-07-07 ENCOUNTER — Other Ambulatory Visit: Payer: Self-pay

## 2024-07-07 VITALS — BP 116/70 | HR 93 | Temp 98.2°F | Resp 18

## 2024-07-07 DIAGNOSIS — U071 COVID-19: Secondary | ICD-10-CM

## 2024-07-07 DIAGNOSIS — R051 Acute cough: Secondary | ICD-10-CM

## 2024-07-07 DIAGNOSIS — J029 Acute pharyngitis, unspecified: Secondary | ICD-10-CM | POA: Diagnosis not present

## 2024-07-07 LAB — POC COVID19/FLU A&B COMBO
Covid Antigen, POC: POSITIVE — AB
Influenza A Antigen, POC: NEGATIVE
Influenza B Antigen, POC: NEGATIVE

## 2024-07-07 MED ORDER — PROMETHAZINE-DM 6.25-15 MG/5ML PO SYRP: 5.0000 mL | ORAL_SOLUTION | Freq: Three times a day (TID) | ORAL | 0 refills | Status: AC | PRN

## 2024-07-07 MED ORDER — PREDNISONE 20 MG PO TABS: 40.0000 mg | ORAL_TABLET | Freq: Every day | ORAL | 0 refills | Status: AC

## 2024-07-07 NOTE — Discharge Instructions (Addendum)
 Flu A, flu B and COVID testing done today.  Flu A and flu B are negative.  COVID is positive. This is a viral infection.  This does not require antibiotic treatment.  We focus treatment on improving the symptoms.  We will treat with the following:  Prednisone  40 mg (2 tablets) once daily for 5 days. Take this in the morning.  This is a steroid to help with inflammation and pain.  Do not take ibuprofen while you are taking this medication.  You may take Tylenol for fever or pain Promethazine  DM 5 mL every 8 hours as needed for cough.  Use caution as this medication can cause drowsiness. Make sure to stay hydrated by drinking plenty of water. Return to urgent care or PCP if symptoms worsen or fail to resolve.

## 2024-07-07 NOTE — ED Provider Notes (Signed)
 [facility]    CSN: [id number] Arrival date & time: 07/07/24  1044      History   Chief Complaint Chief Complaint  Patient presents with   Sore Throat   Cough    Appt    HPI Makayla [name] is a 43 y.o. female.   43 year old female presents urgent care with complaints of sore throat, cough, sinus pressure and nasal congestion.  Her symptoms started on Thursday.  Her first symptom was a cough.  She was unsure if it might have been secondary to a lot of smoke she had been around.  She then woke up with a sore throat, sinus pressure and nasal congestion.  She denies any known exposures.  She denies any fevers, chills, vomiting.  She does have some nausea at baseline secondary to history of anxiety.   Sore Throat Pertinent negatives include no chest pain, no abdominal pain and no shortness of breath.  Cough Associated symptoms: sore throat   Associated symptoms: no chest pain, no chills, no ear pain, no fever, no rash and no shortness of breath     Past Medical History:  Diagnosis Date   Allergy    Anxiety    Asthma    Depression    Hyperlipidemia    IBS (irritable bowel syndrome)    Migraines    OCD (obsessive compulsive disorder)    TMJ syndrome     Patient Active Problem List   Diagnosis Date Noted   Excessive daytime sleepiness 06/14/2024   Loud snoring 06/14/2024   Chronic sinusitis 06/14/2024   Syncope and collapse 02/29/2024   Head injury 02/29/2024   Chronic nausea 02/29/2024   Palpitations 02/29/2024   Chest pain on breathing 02/29/2024   Fatigue 09/09/2023   Abnormal uterine bleeding (AUB) 09/09/2023   Vitamin D  deficiency 09/09/2023   History of seizure 09/09/2023   Headache disorder 04/03/2021   Medication management 02/24/2016   Asthma    IBS (irritable bowel syndrome)    Migraines    TMJ syndrome    Anxiety and depression    Anxiety    OCD (obsessive compulsive disorder)     Past Surgical History:  Procedure Laterality Date    BIOPSY BREAST Bilateral    TONSILLECTOMY Bilateral    TONSILLECTOMY AND ADENOIDECTOMY      OB History   No obstetric history on file.      Home Medications    Prior to Admission medications   Medication Sig Start Date End Date Taking? Authorizing Provider  imipramine  (TOFRANIL ) 50 MG tablet TAKE 1 TABLET BY MOUTH 3 TIMES A DAY 04/04/24  Yes Jaffe, Adam R, DO  predniSONE  (DELTASONE ) 20 MG tablet Take 2 tablets (40 mg total) by mouth daily with breakfast for 5 days. 07/07/24 07/12/24 Yes Tullio Chausse A, PA-C  promethazine  (PHENERGAN ) 25 MG tablet Take 1 tablet (25 mg total) by mouth every 6 (six) hours as needed for nausea or vomiting. 02/24/24  Yes Skeet, Adam R, DO  promethazine -dextromethorphan (PROMETHAZINE -DM) 6.25-15 MG/5ML syrup Take 5 mLs by mouth every 8 (eight) hours as needed for cough. 07/07/24  Yes Esequiel Kleinfelter A, PA-C  VITAMIN D  PO Take by mouth.   Yes [provider]  VRAYLAR 1.5 MG capsule Take 1.5 mg by mouth daily. 06/11/24  Yes [provider]  cyclobenzaprine  (FLEXERIL ) 10 MG tablet Take 1 tablet (10 mg total) by mouth 3 (three) times daily as needed for muscle spasms. 09/27/23   Skeet Juliene SAUNDERS, DO  rizatriptan  (MAXALT ) 10 MG tablet Take 1 tablet at earliest onset of headache.  May repeat in 2 hours if needed.  Maximum 2 tablets in 24 hours. 09/27/23   Skeet Juliene SAUNDERS, DO    Family History History reviewed. No pertinent family history.  Social History Social History   Tobacco Use   Smoking status: Never   Smokeless tobacco: Never  Vaping Use   Vaping status: Never Used  Substance Use Topics   Alcohol use: No   Drug use: No     Allergies   Zoloft [sertraline hcl]   Review of Systems Review of Systems  Constitutional:  Negative for chills and fever.  HENT:  Positive for congestion, sinus pressure and sore throat. Negative for ear pain.   Eyes:  Negative for pain and visual disturbance.  Respiratory:  Positive for cough. Negative for  shortness of breath.   Cardiovascular:  Negative for chest pain and palpitations.  Gastrointestinal:  Negative for abdominal pain and vomiting.  Genitourinary:  Negative for dysuria and hematuria.  Musculoskeletal:  Negative for arthralgias and back pain.  Skin:  Negative for color change and rash.  Neurological:  Negative for seizures and syncope.  All other systems reviewed and are negative.    Physical Exam Triage Vital Signs ED Triage Vitals  Encounter Vitals Group     BP 07/07/24 1124 116/70     Girls Systolic BP Percentile --      Girls Diastolic BP Percentile --      Boys Systolic BP Percentile --      Boys Diastolic BP Percentile --      Pulse Rate 07/07/24 1124 93     Resp 07/07/24 1124 18     Temp 07/07/24 1124 98.2 F (36.8 C)     Temp Source 07/07/24 1124 Oral     SpO2 07/07/24 1124 98 %     Weight --      Height --      Head Circumference --      Peak Flow --      Pain Score 07/07/24 1127 5     Pain Loc --      Pain Education --      Exclude from Growth Chart --    No data found.  Updated Vital Signs BP 116/70   Pulse 93   Temp 98.2 F (36.8 C) (Oral)   Resp 18   LMP 07/03/2024 (Exact Date)   SpO2 98%   Visual Acuity Right Eye Distance:   Left Eye Distance:   Bilateral Distance:    Right Eye Near:   Left Eye Near:    Bilateral Near:     Physical Exam Vitals and nursing note reviewed.  Constitutional:      General: She is not in acute distress.    Appearance: She is well-developed.  HENT:     Head: Normocephalic and atraumatic.     Right Ear: Tympanic membrane normal.     Left Ear: Tympanic membrane normal.     Nose: Mucosal edema, congestion and rhinorrhea present. Rhinorrhea is purulent.     Right Sinus: Maxillary sinus tenderness present.     Left Sinus: Maxillary sinus tenderness present.     Mouth/Throat:     Mouth: Mucous membranes are moist.     Pharynx: Posterior oropharyngeal erythema present.  Eyes:     Conjunctiva/sclera:  Conjunctivae normal.  Cardiovascular:     Rate and Rhythm: Normal rate and regular rhythm.  Heart sounds: No murmur heard. Pulmonary:     Effort: Pulmonary effort is normal. No respiratory distress.     Breath sounds: Normal breath sounds. No decreased breath sounds or wheezing.  Abdominal:     Palpations: Abdomen is soft.     Tenderness: There is no abdominal tenderness.  Musculoskeletal:        General: No swelling.     Cervical back: Neck supple.  Skin:    General: Skin is warm and dry.     Capillary Refill: Capillary refill takes less than 2 seconds.  Neurological:     Mental Status: She is alert.  Psychiatric:        Mood and Affect: Mood normal.      UC Treatments / Results  Labs (all labs ordered are listed, but only abnormal results are displayed) Labs Reviewed  POC COVID19/FLU A&B COMBO - Abnormal; Notable for the following components:      Result Value   Covid Antigen, POC Positive (*)    All other components within normal limits    EKG   Radiology No results found.  Procedures Procedures (including critical care time)  Medications Ordered in UC Medications - No data to display  Initial Impression / Assessment and Plan / UC Course  I have reviewed the triage vital signs and the nursing notes.  Pertinent labs & imaging results that were available during my care of the patient were reviewed by me and considered in my medical decision making (see chart for details).     Acute cough - Plan: POC Covid19/Flu A&B Antigen, POC Covid19/Flu A&B Antigen  Sore throat - Plan: POC Covid19/Flu A&B Antigen, POC Covid19/Flu A&B Antigen  COVID-19   Flu A, flu B and COVID testing done today.  Flu A and flu B are negative.  COVID is positive. This is a viral infection.  This does not require antibiotic treatment.  We focus treatment on improving the symptoms.  We will treat with the following:  Prednisone  40 mg (2 tablets) once daily for 5 days. Take this in the  morning.  This is a steroid to help with inflammation and pain.  Do not take ibuprofen while you are taking this medication.  You may take Tylenol for fever or pain Promethazine  DM 5 mL every 8 hours as needed for cough.  Use caution as this medication can cause drowsiness. Make sure to stay hydrated by drinking plenty of water. Return to urgent care or PCP if symptoms worsen or fail to resolve.    Final Clinical Impressions(s) / UC Diagnoses   Final diagnoses:  Acute cough  Sore throat  COVID-19     Discharge Instructions      Flu A, flu B and COVID testing done today.  Flu A and flu B are negative.  COVID is positive. This is a viral infection.  This does not require antibiotic treatment.  We focus treatment on improving the symptoms.  We will treat with the following:  Prednisone  40 mg (2 tablets) once daily for 5 days. Take this in the morning.  This is a steroid to help with inflammation and pain.  Do not take ibuprofen while you are taking this medication.  You may take Tylenol for fever or pain Promethazine  DM 5 mL every 8 hours as needed for cough.  Use caution as this medication can cause drowsiness. Make sure to stay hydrated by drinking plenty of water. Return to urgent care or PCP if symptoms worsen or  fail to resolve.       ED Prescriptions     Medication Sig Dispense Auth. Provider   predniSONE  (DELTASONE ) 20 MG tablet Take 2 tablets (40 mg total) by mouth daily with breakfast for 5 days. 10 tablet Jerine Surles A, PA-C   promethazine -dextromethorphan (PROMETHAZINE -DM) 6.25-15 MG/5ML syrup Take 5 mLs by mouth every 8 (eight) hours as needed for cough. 180 mL Teresa Almarie LABOR, [place]      PDMP not reviewed this encounter.   Teresa Almarie LABOR, PA-C 07/07/24 1223

## 2024-07-07 NOTE — ED Triage Notes (Addendum)
 States started coughing 2 nights ago while at work [employer] that had a lot of smoke. Yesterday woke with sore throat but had improved cough. Today continues with cough and sore throat, but now having sinus pressure & nasal congestion as well. Took Tyl today for HA. Denies fevers.

## 2024-07-09 NOTE — Telephone Encounter (Signed)
 Pt went to UC 10/4

## 2024-07-13 DIAGNOSIS — F331 Major depressive disorder, recurrent, moderate: Secondary | ICD-10-CM | POA: Diagnosis not present

## 2024-07-13 DIAGNOSIS — F429 Obsessive-compulsive disorder, unspecified: Secondary | ICD-10-CM | POA: Diagnosis not present

## 2024-07-13 DIAGNOSIS — F411 Generalized anxiety disorder: Secondary | ICD-10-CM | POA: Diagnosis not present

## 2024-07-23 ENCOUNTER — Encounter: Payer: Self-pay | Admitting: Family Medicine

## 2024-08-03 ENCOUNTER — Encounter

## 2024-08-10 DIAGNOSIS — F331 Major depressive disorder, recurrent, moderate: Secondary | ICD-10-CM | POA: Diagnosis not present

## 2024-08-10 DIAGNOSIS — F411 Generalized anxiety disorder: Secondary | ICD-10-CM | POA: Diagnosis not present

## 2024-08-13 ENCOUNTER — Encounter: Payer: Self-pay | Admitting: *Deleted

## 2024-08-13 ENCOUNTER — Telehealth: Payer: Self-pay | Admitting: *Deleted

## 2024-08-13 NOTE — Telephone Encounter (Signed)
 ATC patient x1.  Left detailed message per DPR letting her know we do not need to have her appointment tomorrow since her HST has not been done and rescheduled for 12/4.  Advised to call back to reschedule f/u after she has completed HST.  Appointment cancelled.  Nothing further needed.

## 2024-08-14 ENCOUNTER — Ambulatory Visit: Admitting: Nurse Practitioner

## 2024-08-16 DIAGNOSIS — F429 Obsessive-compulsive disorder, unspecified: Secondary | ICD-10-CM | POA: Diagnosis not present

## 2024-08-16 DIAGNOSIS — F411 Generalized anxiety disorder: Secondary | ICD-10-CM | POA: Diagnosis not present

## 2024-08-16 DIAGNOSIS — F330 Major depressive disorder, recurrent, mild: Secondary | ICD-10-CM | POA: Diagnosis not present

## 2024-08-24 DIAGNOSIS — F429 Obsessive-compulsive disorder, unspecified: Secondary | ICD-10-CM | POA: Diagnosis not present

## 2024-08-24 DIAGNOSIS — F411 Generalized anxiety disorder: Secondary | ICD-10-CM | POA: Diagnosis not present

## 2024-08-24 DIAGNOSIS — F331 Major depressive disorder, recurrent, moderate: Secondary | ICD-10-CM | POA: Diagnosis not present

## 2024-08-31 DIAGNOSIS — F331 Major depressive disorder, recurrent, moderate: Secondary | ICD-10-CM | POA: Diagnosis not present

## 2024-08-31 DIAGNOSIS — F411 Generalized anxiety disorder: Secondary | ICD-10-CM | POA: Diagnosis not present

## 2024-09-06 ENCOUNTER — Ambulatory Visit

## 2024-09-06 DIAGNOSIS — G4719 Other hypersomnia: Secondary | ICD-10-CM

## 2024-09-06 DIAGNOSIS — R0683 Snoring: Secondary | ICD-10-CM

## 2024-09-07 DIAGNOSIS — F331 Major depressive disorder, recurrent, moderate: Secondary | ICD-10-CM | POA: Diagnosis not present

## 2024-09-07 DIAGNOSIS — F411 Generalized anxiety disorder: Secondary | ICD-10-CM | POA: Diagnosis not present

## 2024-09-18 ENCOUNTER — Encounter: Payer: Self-pay | Admitting: Neurology

## 2024-09-19 DIAGNOSIS — G4733 Obstructive sleep apnea (adult) (pediatric): Secondary | ICD-10-CM | POA: Diagnosis not present

## 2024-09-21 ENCOUNTER — Other Ambulatory Visit (HOSPITAL_COMMUNITY): Payer: Self-pay

## 2024-09-21 ENCOUNTER — Telehealth: Payer: Self-pay | Admitting: Pharmacy Technician

## 2024-09-21 NOTE — Telephone Encounter (Signed)
 Pharmacy Patient Advocate Encounter  Received notification from [employer] that Prior Authorization for AIMOVIG  140MG  has been APPROVED from 12.19.25 to 12.19.26. Ran test claim, Copay is $.4. This test claim was processed through [facility]- copay amounts may vary at other pharmacies due to pharmacy/plan contracts, or as the patient moves through the different stages of their insurance plan.   PA #/Case ID/Reference #: [id number]

## 2024-09-21 NOTE — Telephone Encounter (Signed)
 Pharmacy Patient Advocate Encounter   Received notification from CoverMyMeds that prior authorization for AIMOVIG  140MG  is required/requested.   Insurance verification completed.   The patient is insured through [facility].   Per test claim: PA required; PA submitted to above mentioned insurance via Latent Key/confirmation #/EOC [id number] Status is pending

## 2024-10-11 ENCOUNTER — Telehealth: Payer: Self-pay

## 2024-10-11 NOTE — Telephone Encounter (Signed)
 Copied from CRM [id number]. Topic: Clinical - Lab/Test Results >> Oct 09, 2024  9:24 AM Devaughn RAMAN wrote: Reason for CRM: Pt called in regards to sleep study results.  ATC X1. LMTCB. Pt's sleep study needs to be read first. Once her provider has reviewed, we can deliver results.   DR Neda, can you advise results

## 2024-10-15 ENCOUNTER — Telehealth: Payer: Self-pay

## 2024-10-15 ENCOUNTER — Other Ambulatory Visit: Payer: Self-pay

## 2024-10-15 ENCOUNTER — Other Ambulatory Visit: Payer: Self-pay | Admitting: Neurology

## 2024-10-15 ENCOUNTER — Ambulatory Visit: Admitting: Neurology

## 2024-10-15 DIAGNOSIS — G4733 Obstructive sleep apnea (adult) (pediatric): Secondary | ICD-10-CM

## 2024-10-15 NOTE — Telephone Encounter (Signed)
 Sleep study reviewed showing moderate obstructive sleep apnea with AHI of 17.7 with O2 nadir of 87  Recommendation will be for CPAP Auto CPAP 5-15 with heated humidification with patient's mask of choice, an oral device may be considered if patient feels will not be able to tolerate CPAP therapy  Follow-up following initiation of therapy or prior to initiation of therapy if patient has further questions

## 2024-10-15 NOTE — Telephone Encounter (Signed)
 Copied from CRM [id number]. Topic: Clinical - Lab/Test Results >> Oct 09, 2024  9:24 AM Devaughn RAMAN wrote: Reason for CRM: Pt called in regards to sleep study results. >> Oct 15, 2024 10:47 AM Russell PARAS wrote: Pt returning call from [facility] regarding sleep study results. Contacted [facility], and was advised by Marval that Ashlyn is out of office.   Pt requested call back  CB#  [phone]   Spoke with patient relay Sleep study results  Order placed

## 2024-10-16 ENCOUNTER — Telehealth: Payer: Self-pay | Admitting: Pulmonary Disease

## 2024-10-16 NOTE — Telephone Encounter (Signed)
 Can  you sign this order, please?

## 2024-10-17 NOTE — Telephone Encounter (Signed)
 Pt needs Refill imipramine  (TOFRANIL ) 50 MG tablet Arloa Prior, ASAP

## 2024-10-18 NOTE — Telephone Encounter (Signed)
ATC x2.  LVM to return call.

## 2024-10-19 ENCOUNTER — Telehealth: Payer: Self-pay

## 2024-10-19 NOTE — Telephone Encounter (Signed)
 Looks like thos results have already been relayed and order has already been placed

## 2024-10-19 NOTE — Telephone Encounter (Signed)
 Copied from CRM [id number]. Topic: Clinical - Lab/Test Results >> Oct 19, 2024 11:30 AM Dedra B wrote: Reason for CRM: Patient returning call regarding sleep study results.  Dr Neda, can you advise the sleep study results

## 2025-01-15 ENCOUNTER — Ambulatory Visit: Admitting: Neurology
